# Patient Record
Sex: Male | Born: 1980 | Race: Black or African American | Hispanic: No | Marital: Single | Smoking: Former smoker
Health system: Southern US, Community
[De-identification: ages and names within clinical notes are randomized; demographics above are authoritative.]

---

## 2003-04-05 ENCOUNTER — Emergency Department (HOSPITAL_COMMUNITY): Admission: EM | Admit: 2003-04-05 | Discharge: 2003-04-05 | Payer: Self-pay | Admitting: Emergency Medicine

## 2003-04-15 ENCOUNTER — Emergency Department (HOSPITAL_COMMUNITY): Admission: EM | Admit: 2003-04-15 | Discharge: 2003-04-15 | Payer: Self-pay | Admitting: Emergency Medicine

## 2003-07-31 ENCOUNTER — Emergency Department (HOSPITAL_COMMUNITY): Admission: EM | Admit: 2003-07-31 | Discharge: 2003-07-31 | Payer: Self-pay | Admitting: Emergency Medicine

## 2003-08-07 ENCOUNTER — Emergency Department (HOSPITAL_COMMUNITY): Admission: EM | Admit: 2003-08-07 | Discharge: 2003-08-07 | Payer: Self-pay | Admitting: Emergency Medicine

## 2003-08-09 ENCOUNTER — Emergency Department (HOSPITAL_COMMUNITY): Admission: EM | Admit: 2003-08-09 | Discharge: 2003-08-09 | Payer: Self-pay | Admitting: *Deleted

## 2003-08-11 ENCOUNTER — Emergency Department (HOSPITAL_COMMUNITY): Admission: EM | Admit: 2003-08-11 | Discharge: 2003-08-11 | Payer: Self-pay | Admitting: Emergency Medicine

## 2003-11-30 ENCOUNTER — Emergency Department (HOSPITAL_COMMUNITY): Admission: EM | Admit: 2003-11-30 | Discharge: 2003-11-30 | Payer: Self-pay | Admitting: Emergency Medicine

## 2004-04-14 ENCOUNTER — Emergency Department (HOSPITAL_COMMUNITY): Admission: EM | Admit: 2004-04-14 | Discharge: 2004-04-14 | Payer: Self-pay | Admitting: Emergency Medicine

## 2004-04-25 ENCOUNTER — Emergency Department (HOSPITAL_COMMUNITY): Admission: EM | Admit: 2004-04-25 | Discharge: 2004-04-25 | Payer: Self-pay | Admitting: Emergency Medicine

## 2010-07-12 ENCOUNTER — Emergency Department (HOSPITAL_BASED_OUTPATIENT_CLINIC_OR_DEPARTMENT_OTHER)
Admission: EM | Admit: 2010-07-12 | Discharge: 2010-07-12 | Disposition: A | Discharge status: Home / Self Care | Payer: Self-pay | Attending: Emergency Medicine | Admitting: Emergency Medicine

## 2010-07-12 DIAGNOSIS — J45909 Unspecified asthma, uncomplicated: Secondary | ICD-10-CM | POA: Insufficient documentation

## 2010-07-12 DIAGNOSIS — F17200 Nicotine dependence, unspecified, uncomplicated: Secondary | ICD-10-CM | POA: Insufficient documentation

## 2010-08-04 ENCOUNTER — Emergency Department (HOSPITAL_BASED_OUTPATIENT_CLINIC_OR_DEPARTMENT_OTHER)
Admission: EM | Admit: 2010-08-04 | Discharge: 2010-08-04 | Disposition: A | Discharge status: Home / Self Care | Payer: Self-pay | Attending: Emergency Medicine | Admitting: Emergency Medicine

## 2010-08-04 DIAGNOSIS — F17200 Nicotine dependence, unspecified, uncomplicated: Secondary | ICD-10-CM | POA: Insufficient documentation

## 2010-08-04 DIAGNOSIS — J45901 Unspecified asthma with (acute) exacerbation: Secondary | ICD-10-CM | POA: Insufficient documentation

## 2010-08-04 DIAGNOSIS — R0602 Shortness of breath: Secondary | ICD-10-CM | POA: Insufficient documentation

## 2010-12-06 ENCOUNTER — Encounter: Payer: Self-pay | Admitting: *Deleted

## 2010-12-06 ENCOUNTER — Emergency Department (HOSPITAL_BASED_OUTPATIENT_CLINIC_OR_DEPARTMENT_OTHER)
Admission: EM | Admit: 2010-12-06 | Discharge: 2010-12-07 | Disposition: A | Discharge status: Home / Self Care | Payer: Self-pay | Attending: Emergency Medicine | Admitting: Emergency Medicine

## 2010-12-06 DIAGNOSIS — J069 Acute upper respiratory infection, unspecified: Secondary | ICD-10-CM | POA: Insufficient documentation

## 2010-12-06 DIAGNOSIS — R0602 Shortness of breath: Secondary | ICD-10-CM | POA: Insufficient documentation

## 2010-12-06 DIAGNOSIS — J45909 Unspecified asthma, uncomplicated: Secondary | ICD-10-CM | POA: Insufficient documentation

## 2010-12-06 NOTE — ED Notes (Signed)
atient states he "having problems with his breathing". He has asthma and has used his nebulizer today

## 2010-12-07 MED ORDER — PREDNISONE 10 MG PO TABS: 20.0000 mg | ORAL_TABLET | Freq: Every day | ORAL | Status: AC

## 2010-12-07 MED ORDER — ALBUTEROL SULFATE (5 MG/ML) 0.5% IN NEBU
INHALATION_SOLUTION | RESPIRATORY_TRACT | Status: AC
  Administered 2010-12-07: 5 mg via RESPIRATORY_TRACT
  Filled 2010-12-07: qty 1

## 2010-12-07 MED ORDER — ALBUTEROL SULFATE (2.5 MG/3ML) 0.083% IN NEBU: 2.5000 mg | INHALATION_SOLUTION | Freq: Four times a day (QID) | RESPIRATORY_TRACT | Status: DC | PRN

## 2010-12-07 MED ORDER — PREDNISONE 50 MG PO TABS
60.0000 mg | ORAL_TABLET | Freq: Once | ORAL | Status: AC
  Administered 2010-12-07: 60 mg via ORAL
  Filled 2010-12-07: qty 1

## 2010-12-07 MED ORDER — ALBUTEROL SULFATE (5 MG/ML) 0.5% IN NEBU
5.0000 mg | INHALATION_SOLUTION | Freq: Once | RESPIRATORY_TRACT | Status: AC
  Administered 2010-12-07: 5 mg via RESPIRATORY_TRACT

## 2010-12-07 MED ORDER — ALBUTEROL SULFATE HFA 108 (90 BASE) MCG/ACT IN AERS
2.0000 | INHALATION_SPRAY | RESPIRATORY_TRACT | Status: DC | PRN
  Administered 2010-12-07: 2 via RESPIRATORY_TRACT
  Filled 2010-12-07: qty 6.7

## 2010-12-07 MED ORDER — IPRATROPIUM BROMIDE 0.02 % IN SOLN
RESPIRATORY_TRACT | Status: AC
  Administered 2010-12-07: 0.5 mg via RESPIRATORY_TRACT
  Filled 2010-12-07: qty 2.5

## 2010-12-07 MED ORDER — IPRATROPIUM BROMIDE 0.02 % IN SOLN
0.5000 mg | Freq: Once | RESPIRATORY_TRACT | Status: AC
  Administered 2010-12-07: 0.5 mg via RESPIRATORY_TRACT

## 2010-12-07 MED ORDER — ALBUTEROL SULFATE (5 MG/ML) 0.5% IN NEBU: 2.5000 mg | INHALATION_SOLUTION | Freq: Once | RESPIRATORY_TRACT | Status: DC

## 2010-12-07 NOTE — ED Provider Notes (Signed)
History     CSN: [id number] Arrival date & time: 12/06/2010 11:52 PM   First MD Initiated Contact with Patient 12/07/10 0050      Chief Complaint  Patient presents with  . Shortness of Breath    (Consider location/radiation/quality/duration/timing/severity/associated sxs/prior treatment) Patient is a 30 y.o. male presenting with shortness of breath. The history is provided by the patient.  Shortness of Breath  The current episode started yesterday. The problem occurs continuously. The problem has been gradually worsening. The problem is moderate. Associated symptoms include cough and wheezing. Pertinent negatives include no chest pain, no fever and no shortness of breath.   patient is a smoker has a history of asthma but does not have an inhaler. He is requesting a prescription for his nebulizer at home as well as a hand-held inhaler as well. No fevers or chills or sick contacts. No nausea or vomiting. He does have some congestion last few days but no sore throat, rashes or recent travel. Severity is moderate in nature. No alleviating or exacerbating factors.  Past Medical History  Diagnosis Date  . Asthma     History reviewed. No pertinent past surgical history.  No family history on file.  History  Substance Use Topics  . Smoking status: Former Games developer  . Smokeless tobacco: Not on file  . Alcohol Use:       Review of Systems  Constitutional: Negative for fever and chills.  HENT: Negative for neck pain and neck stiffness.   Eyes: Negative for pain.  Respiratory: Positive for cough and wheezing. Negative for shortness of breath.   Cardiovascular: Negative for chest pain.  Gastrointestinal: Negative for abdominal pain.  Genitourinary: Negative for dysuria.  Musculoskeletal: Negative for back pain.  Skin: Negative for rash.  Neurological: Negative for headaches.  All other systems reviewed and are negative.    Allergies  Review of patient's allergies indicates no  known allergies.  Home Medications   Current Outpatient Rx  Name Route Sig Dispense Refill  . ALBUTEROL SULFATE (5 MG/ML) 0.5% IN NEBU Nebulization Take 2.5 mg by nebulization every 6 (six) hours as needed.        BP 140/80  Pulse 87  Temp(Src) 98.1 F (36.7 C) (Oral)  Resp 20  SpO2 98%  Physical Exam  Constitutional: He is oriented to person, place, and time. He appears well-developed and well-nourished.  HENT:  Head: Normocephalic and atraumatic.  Eyes: Conjunctivae and EOM are normal. Pupils are equal, round, and reactive to light.  Neck: Trachea normal. Neck supple. No thyromegaly present.  Cardiovascular: Normal rate, regular rhythm, S1 normal, S2 normal and normal pulses.     No systolic murmur is present   No diastolic murmur is present  Pulses:      Radial pulses are 2+ on the right side, and 2+ on the left side.  Pulmonary/Chest: Effort normal. He has wheezes. He has no rhonchi.  Abdominal: Soft. Normal appearance and bowel sounds are normal. There is no tenderness. There is no CVA tenderness and negative Murphy's sign.  Musculoskeletal:       BLE:s Calves nontender, no cords or erythema, negative Homans sign  Neurological: He is alert and oriented to person, place, and time. He has normal strength. No cranial nerve deficit or sensory deficit. GCS eye subscore is 4. GCS verbal subscore is 5. GCS motor subscore is 6.  Skin: Skin is warm and dry. No rash noted. He is not diaphoretic.  Psychiatric: His speech is normal.  Cooperative and appropriate    ED Course  Procedures (including critical care time)  Albuterol treatment which improved symptoms on recheck wheezes resolved.   MDM   Clinical URI with wheezing. Albuterol provided. By mouth steroids. Prescriptions provided for prednisone and albuterol. No hypoxia or respiratory distress, stable for discharge home        Sunnie Nielsen, MD 12/07/10 0145

## 2010-12-07 NOTE — ED Notes (Signed)
Pt has an hx of asthma and states that he took his HHN Albuterol tx and his MDI Albuterol with no relief. Pt states that he still feels tight and SHOB. Pt does has a right sided inspiratory wheeze.

## 2011-09-12 ENCOUNTER — Encounter (HOSPITAL_BASED_OUTPATIENT_CLINIC_OR_DEPARTMENT_OTHER): Payer: Self-pay | Admitting: *Deleted

## 2011-09-12 ENCOUNTER — Emergency Department (HOSPITAL_BASED_OUTPATIENT_CLINIC_OR_DEPARTMENT_OTHER)
Admission: EM | Admit: 2011-09-12 | Discharge: 2011-09-12 | Disposition: A | Discharge status: Home / Self Care | Payer: Self-pay | Attending: Emergency Medicine | Admitting: Emergency Medicine

## 2011-09-12 DIAGNOSIS — J45901 Unspecified asthma with (acute) exacerbation: Secondary | ICD-10-CM | POA: Insufficient documentation

## 2011-09-12 DIAGNOSIS — Z87891 Personal history of nicotine dependence: Secondary | ICD-10-CM | POA: Insufficient documentation

## 2011-09-12 MED ORDER — IPRATROPIUM BROMIDE 0.02 % IN SOLN
0.5000 mg | Freq: Once | RESPIRATORY_TRACT | Status: AC
  Administered 2011-09-12: 0.5 mg via RESPIRATORY_TRACT

## 2011-09-12 MED ORDER — ALBUTEROL SULFATE (5 MG/ML) 0.5% IN NEBU
INHALATION_SOLUTION | RESPIRATORY_TRACT | Status: AC
  Administered 2011-09-12: 5 mg via RESPIRATORY_TRACT
  Filled 2011-09-12: qty 1

## 2011-09-12 MED ORDER — IPRATROPIUM BROMIDE 0.02 % IN SOLN
RESPIRATORY_TRACT | Status: AC
  Administered 2011-09-12: 0.5 mg via RESPIRATORY_TRACT
  Filled 2011-09-12: qty 2.5

## 2011-09-12 MED ORDER — ALBUTEROL SULFATE (5 MG/ML) 0.5% IN NEBU
5.0000 mg | INHALATION_SOLUTION | Freq: Once | RESPIRATORY_TRACT | Status: AC
  Administered 2011-09-12: 5 mg via RESPIRATORY_TRACT

## 2011-09-12 MED ORDER — ALBUTEROL SULFATE HFA 108 (90 BASE) MCG/ACT IN AERS: 2.0000 | INHALATION_SPRAY | RESPIRATORY_TRACT | Status: DC | PRN

## 2011-09-12 MED ORDER — DEXAMETHASONE SODIUM PHOSPHATE 10 MG/ML IJ SOLN
10.0000 mg | Freq: Once | INTRAMUSCULAR | Status: AC
  Administered 2011-09-12: 10 mg
  Filled 2011-09-12: qty 1

## 2011-09-12 MED ORDER — ALBUTEROL SULFATE HFA 108 (90 BASE) MCG/ACT IN AERS
2.0000 | INHALATION_SPRAY | Freq: Once | RESPIRATORY_TRACT | Status: AC
  Administered 2011-09-12: 2 via RESPIRATORY_TRACT
  Filled 2011-09-12: qty 6.7

## 2011-09-12 NOTE — ED Provider Notes (Signed)
History   This chart was scribed for Bryan Razor, MD by Shari Heritage. The patient was seen in room MH02/MH02. Patient's care was started at 1555.     CSN: [id number]  Arrival date & time 09/12/11  1555   First MD Initiated Contact with Patient 09/12/11 1601      Chief Complaint  Patient presents with  . Shortness of Breath  . Asthma    (Consider location/radiation/quality/duration/timing/severity/associated sxs/prior treatment) Patient is a 31 y.o. male presenting with shortness of breath and asthma. The history is provided by the patient. No language interpreter was used.  Shortness of Breath  The current episode started 3 to 5 days ago. The problem occurs continuously. The problem has been gradually worsening. The problem is moderate. The symptoms are relieved by beta-agonist inhalers. Nothing aggravates the symptoms. Associated symptoms include chest pain and shortness of breath. Pertinent negatives include no fever, no stridor and no wheezing. There was no intake of a foreign body. The Heimlich maneuver was not attempted. His past medical history is significant for asthma. He has been behaving normally.  Asthma This is a chronic problem. Associated symptoms include chest pain and shortness of breath.    Bryan Wilkinson is a 31 y.o. male with a h/o of asthma who presents to the Emergency Department complaining of gradually worsening SOB onset 3 days ago. Associated symptoms include chest pain. He currently uses an albuterol inhaler and a nebulizer. Patient says that he experiences weekly exacerbation of his asthma symptoms and has had to visit the hospital for treatment. Patient denies fever or chills. Patient denies any other significant medical or surgical history. He is a former smoker. Patient does not have PCP. He typically gets medical prescriptions from the hospital. Patient doesn't have insurance.  Past Medical History  Diagnosis Date  . Asthma     History reviewed. No  pertinent past surgical history.  No family history on file.  History  Substance Use Topics  . Smoking status: Former Games developer  . Smokeless tobacco: Never Used  . Alcohol Use: Not on file      Review of Systems  Constitutional: Negative for fever and chills.  Respiratory: Positive for shortness of breath. Negative for wheezing and stridor.   Cardiovascular: Positive for chest pain.  All other systems reviewed and are negative.    Allergies  Review of patient's allergies indicates no known allergies.  Home Medications   Current Outpatient Rx  Name Route Sig Dispense Refill  . ALBUTEROL SULFATE (2.5 MG/3ML) 0.083% IN NEBU Nebulization Take 3 mLs (2.5 mg total) by nebulization every 6 (six) hours as needed for wheezing. 75 mL 12  . ALBUTEROL SULFATE (5 MG/ML) 0.5% IN NEBU Nebulization Take 2.5 mg by nebulization every 6 (six) hours as needed.        BP 148/90  Pulse 98  Temp 98.9 F (37.2 C) (Oral)  Resp 20  Ht 6' (1.829 m)  Wt 243 lb (110.224 kg)  BMI 32.96 kg/m2  SpO2 97%  Physical Exam  Nursing note and vitals reviewed. Constitutional: He appears well-developed and well-nourished. No distress.  HENT:  Head: Normocephalic and atraumatic.  Eyes: Conjunctivae are normal. Right eye exhibits no discharge. Left eye exhibits no discharge.  Neck: Neck supple.  Cardiovascular: Regular rhythm and normal heart sounds.  Tachycardia present.  Exam reveals no gallop and no friction rub.   No murmur heard.      Heart was slightly tachycardic.  Pulmonary/Chest: Effort normal and breath sounds  normal. No respiratory distress. He has no rhonchi. He has no rales.       Diminished breath sounds.  Abdominal: Soft. He exhibits no distension. There is no tenderness.  Musculoskeletal: He exhibits no edema and no tenderness.       No calf tenderness. No lower extremity edema. No palpable cords. Negative homans.  Neurological: He is alert.  Skin: Skin is warm and dry.  Psychiatric: He  has a normal mood and affect. His behavior is normal. Thought content normal.    ED Course  Procedures (including critical care time) DIAGNOSTIC STUDIES: Oxygen Saturation is 97% on room air, adeaquat by my interpretation.    COORDINATION OF CARE: 4:08pm- Patient informed of current plan for treatment and evaluation and agrees with plan at this time.     Labs Reviewed - No data to display  No results found.   1. Asthma exacerbation       MDM  31yf with mild asthma exacerbation. No indication for imaging. Plan continued symptomatic tx. Given very mild severity of symptoms will defer from further steroids.      I personally preformed the services scribed in my presence. The recorded information has been reviewed and considered. Bryan Razor, MD.    Bryan Razor, MD 09/17/11 [phone]

## 2011-09-12 NOTE — ED Notes (Signed)
Pt has hx of asthma- feels sob x 3 days- out of inhaler

## 2012-10-17 ENCOUNTER — Emergency Department (HOSPITAL_COMMUNITY)
Admission: EM | Admit: 2012-10-17 | Discharge: 2012-10-17 | Disposition: A | Discharge status: Home / Self Care | Payer: Self-pay | Attending: Emergency Medicine | Admitting: Emergency Medicine

## 2012-10-17 ENCOUNTER — Emergency Department (HOSPITAL_COMMUNITY): Payer: Self-pay

## 2012-10-17 ENCOUNTER — Encounter (HOSPITAL_COMMUNITY): Payer: Self-pay | Admitting: Emergency Medicine

## 2012-10-17 DIAGNOSIS — R109 Unspecified abdominal pain: Secondary | ICD-10-CM

## 2012-10-17 DIAGNOSIS — R1032 Left lower quadrant pain: Secondary | ICD-10-CM | POA: Insufficient documentation

## 2012-10-17 DIAGNOSIS — Z87891 Personal history of nicotine dependence: Secondary | ICD-10-CM | POA: Insufficient documentation

## 2012-10-17 DIAGNOSIS — J45901 Unspecified asthma with (acute) exacerbation: Secondary | ICD-10-CM | POA: Insufficient documentation

## 2012-10-17 DIAGNOSIS — J9801 Acute bronchospasm: Secondary | ICD-10-CM

## 2012-10-17 LAB — CBC WITH DIFFERENTIAL/PLATELET
Basophils Absolute: 0.1 10*3/uL (ref 0.0–0.1)
Basophils Relative: 1 % (ref 0–1)
Hemoglobin: 15.1 g/dL (ref 13.0–17.0)
MCHC: 35.9 g/dL (ref 30.0–36.0)
Monocytes Relative: 9 % (ref 3–12)
Neutro Abs: 4.9 10*3/uL (ref 1.7–7.7)
Neutrophils Relative %: 55 % (ref 43–77)
Platelets: 228 10*3/uL (ref 150–400)
RDW: 12.3 % (ref 11.5–15.5)

## 2012-10-17 LAB — COMPREHENSIVE METABOLIC PANEL WITH GFR
ALT: 15 U/L (ref 0–53)
AST: 13 U/L (ref 0–37)
Albumin: 3.7 g/dL (ref 3.5–5.2)
Alkaline Phosphatase: 51 U/L (ref 39–117)
BUN: 10 mg/dL (ref 6–23)
CO2: 25 meq/L (ref 19–32)
Calcium: 9.7 mg/dL (ref 8.4–10.5)
Chloride: 103 meq/L (ref 96–112)
Creatinine, Ser: 0.94 mg/dL (ref 0.50–1.35)
GFR calc Af Amer: >90 mL/min
GFR calc non Af Amer: >90 mL/min
Glucose, Bld: 105 mg/dL — ABNORMAL HIGH (ref 70–99)
Potassium: 3.8 meq/L (ref 3.5–5.1)
Sodium: 140 meq/L (ref 135–145)
Total Bilirubin: 0.5 mg/dL (ref 0.3–1.2)
Total Protein: 7.5 g/dL (ref 6.0–8.3)

## 2012-10-17 LAB — URINALYSIS, ROUTINE W REFLEX MICROSCOPIC
Bilirubin Urine: NEGATIVE
Glucose, UA: NEGATIVE mg/dL
Hgb urine dipstick: NEGATIVE
Ketones, ur: NEGATIVE mg/dL
Leukocytes, UA: NEGATIVE
Nitrite: NEGATIVE
Protein, ur: NEGATIVE mg/dL
Specific Gravity, Urine: 1.027 (ref 1.005–1.030)
Urobilinogen, UA: 0.2 mg/dL (ref 0.0–1.0)
pH: 6.5 (ref 5.0–8.0)

## 2012-10-17 MED ORDER — ALBUTEROL SULFATE HFA 108 (90 BASE) MCG/ACT IN AERS
2.0000 | INHALATION_SPRAY | RESPIRATORY_TRACT | Status: DC
  Administered 2012-10-17: 2 via RESPIRATORY_TRACT
  Filled 2012-10-17: qty 6.7

## 2012-10-17 NOTE — ED Notes (Signed)
Pt c/o abdominal pain since Thursday. Also c/o increase sob due to asthma, requesting prescription for breathing treatment. Left lower abdominal pain, intermittent sharp pain. Vomiting once this morning, states "there was a little bit of blood in my vomit". Constipation since pain started, took laxatives, "helped a little". Pain now 6/10, feels short of breath.

## 2012-10-17 NOTE — ED Provider Notes (Signed)
CSN: [id number]     Arrival date & time 10/17/12  1503 History   First MD Initiated Contact with Patient 10/17/12 1807     Chief Complaint  Patient presents with  . Abdominal Pain   (Consider location/radiation/quality/duration/timing/severity/associated sxs/prior Treatment) Patient is a 32 y.o. male presenting with abdominal pain. The history is provided by the patient.  Abdominal Pain  patient complaining of four-day history of suprapubic and left lower quadrant pain. Symptoms characterized as sharp and have gradually been getting better. Was concerned that he was constipated and did use a laxative without any relief. Denies any dysuria or hematuria. No fever vomiting or diarrhea. No prior history of same. Also complains of being short of breath from his baseline history of asthma and states that he has run out of his inhaler.  Past Medical History  Diagnosis Date  . Asthma    History reviewed. No pertinent past surgical history. History reviewed. No pertinent family history. History  Substance Use Topics  . Smoking status: Former Games developer  . Smokeless tobacco: Never Used  . Alcohol Use: Not on file    Review of Systems  Gastrointestinal: Positive for abdominal pain.  All other systems reviewed and are negative.    Allergies  Review of patient's allergies indicates no known allergies.  Home Medications   Current Outpatient Rx  Name  Route  Sig  Dispense  Refill  . EXPIRED: albuterol (PROVENTIL HFA;VENTOLIN HFA) 108 (90 BASE) MCG/ACT inhaler   Inhalation   Inhale 2 puffs into the lungs every 4 (four) hours as needed for wheezing.   1 Inhaler   3   . EXPIRED: albuterol (PROVENTIL) (2.5 MG/3ML) 0.083% nebulizer solution   Nebulization   Take 3 mLs (2.5 mg total) by nebulization every 6 (six) hours as needed for wheezing.   75 mL   12   . albuterol (PROVENTIL) (5 MG/ML) 0.5% nebulizer solution   Nebulization   Take 2.5 mg by nebulization every 6 (six) hours as needed.             BP 125/69  Pulse 77  Temp(Src) 97.9 F (36.6 C) (Oral)  Resp 16  SpO2 98% Physical Exam  Nursing note and vitals reviewed. Constitutional: He is oriented to person, place, and time. He appears well-developed and well-nourished.  Non-toxic appearance. No distress.  HENT:  Head: Normocephalic and atraumatic.  Eyes: Conjunctivae, EOM and lids are normal. Pupils are equal, round, and reactive to light.  Neck: Normal range of motion. Neck supple. No tracheal deviation present. No mass present.  Cardiovascular: Normal rate, regular rhythm and normal heart sounds.  Exam reveals no gallop.   No murmur heard. Pulmonary/Chest: Effort normal. No stridor. No respiratory distress. He has decreased breath sounds. He has no wheezes. He has no rhonchi. He has no rales.  Abdominal: Soft. Normal appearance and bowel sounds are normal. He exhibits no distension. There is tenderness in the left lower quadrant. There is no rigidity, no rebound, no guarding and no CVA tenderness.  Musculoskeletal: Normal range of motion. He exhibits no edema and no tenderness.  Neurological: He is alert and oriented to person, place, and time. He has normal strength. No cranial nerve deficit or sensory deficit. GCS eye subscore is 4. GCS verbal subscore is 5. GCS motor subscore is 6.  Skin: Skin is warm and dry. No abrasion and no rash noted.  Psychiatric: He has a normal mood and affect. His speech is normal and behavior is normal.  ED Course  Procedures (including critical care time) Labs Review Labs Reviewed  COMPREHENSIVE METABOLIC PANEL - Abnormal; Notable for the following:    Glucose, Bld 105 (*)    All other components within normal limits  CBC WITH DIFFERENTIAL  URINALYSIS, ROUTINE W REFLEX MICROSCOPIC   Imaging Review Dg Abd Acute W/chest  10/17/2012   CLINICAL DATA:  Abdominal pain, left lower quadrant pain.  EXAM: ACUTE ABDOMEN SERIES (ABDOMEN 2 VIEW & CHEST 1 VIEW)  COMPARISON:  08/09/2003   FINDINGS: There is no evidence of dilated bowel loops or free intraperitoneal air. No radiopaque calculi or other significant radiographic abnormality is seen. Heart size and mediastinal contours are within normal limits. Both lungs are clear.  IMPRESSION: Negative abdominal radiographs.  No acute cardiopulmonary disease.   Electronically Signed   By: Charlett Nose   On: 10/17/2012 15:46    MDM  No diagnosis found. Patient given albuterol here feels better. His abdomen is nonsurgical at this time. He is requesting to be discharged    Toy Baker, MD 10/17/12 2014

## 2012-10-17 NOTE — ED Notes (Signed)
Patient is requesting to go home; informed EDP. Dr. Freida Busman states that he will come see the patient

## 2012-10-17 NOTE — ED Notes (Signed)
MD at bedside. 

## 2012-10-17 NOTE — ED Notes (Signed)
Pt c/o lower abd pain; pt sts thinks could be constipated and used laxative with some relief; pt sts pain x 4 days

## 2012-10-30 ENCOUNTER — Emergency Department (HOSPITAL_COMMUNITY)
Admission: EM | Admit: 2012-10-30 | Discharge: 2012-10-30 | Disposition: A | Discharge status: Home / Self Care | Payer: Self-pay | Attending: Emergency Medicine | Admitting: Emergency Medicine

## 2012-10-30 DIAGNOSIS — Z79899 Other long term (current) drug therapy: Secondary | ICD-10-CM | POA: Insufficient documentation

## 2012-10-30 DIAGNOSIS — J45901 Unspecified asthma with (acute) exacerbation: Secondary | ICD-10-CM | POA: Insufficient documentation

## 2012-10-30 DIAGNOSIS — J4521 Mild intermittent asthma with (acute) exacerbation: Secondary | ICD-10-CM

## 2012-10-30 DIAGNOSIS — Z87891 Personal history of nicotine dependence: Secondary | ICD-10-CM | POA: Insufficient documentation

## 2012-10-30 DIAGNOSIS — J45909 Unspecified asthma, uncomplicated: Secondary | ICD-10-CM

## 2012-10-30 DIAGNOSIS — R0789 Other chest pain: Secondary | ICD-10-CM | POA: Insufficient documentation

## 2012-10-30 DIAGNOSIS — R059 Cough, unspecified: Secondary | ICD-10-CM | POA: Insufficient documentation

## 2012-10-30 DIAGNOSIS — R05 Cough: Secondary | ICD-10-CM | POA: Insufficient documentation

## 2012-10-30 MED ORDER — ALBUTEROL SULFATE HFA 108 (90 BASE) MCG/ACT IN AERS: 2.0000 | INHALATION_SPRAY | RESPIRATORY_TRACT | Status: DC | PRN

## 2012-10-30 MED ORDER — ALBUTEROL SULFATE (5 MG/ML) 0.5% IN NEBU
2.5000 mg | INHALATION_SOLUTION | Freq: Once | RESPIRATORY_TRACT | Status: AC
  Administered 2012-10-30: 2.5 mg via RESPIRATORY_TRACT
  Filled 2012-10-30: qty 0.5

## 2012-10-30 MED ORDER — ALBUTEROL SULFATE HFA 108 (90 BASE) MCG/ACT IN AERS
2.0000 | INHALATION_SPRAY | Freq: Once | RESPIRATORY_TRACT | Status: AC
  Administered 2012-10-30: 2 via RESPIRATORY_TRACT
  Filled 2012-10-30: qty 6.7

## 2012-10-30 MED ORDER — ALBUTEROL SULFATE (2.5 MG/3ML) 0.083% IN NEBU: 2.5000 mg | INHALATION_SOLUTION | Freq: Four times a day (QID) | RESPIRATORY_TRACT | Status: DC | PRN

## 2012-10-30 MED ORDER — DEXAMETHASONE SODIUM PHOSPHATE 10 MG/ML IJ SOLN
10.0000 mg | Freq: Once | INTRAMUSCULAR | Status: AC
  Administered 2012-10-30: 10 mg via INTRAMUSCULAR
  Filled 2012-10-30: qty 1

## 2012-10-30 MED ORDER — IPRATROPIUM BROMIDE 0.02 % IN SOLN
0.5000 mg | Freq: Once | RESPIRATORY_TRACT | Status: AC
  Administered 2012-10-30: 0.5 mg via RESPIRATORY_TRACT
  Filled 2012-10-30: qty 2.5

## 2012-10-30 NOTE — ED Notes (Signed)
Bed: WTR8 Expected date:  Expected time:  Means of arrival:  Comments: Gu

## 2012-10-30 NOTE — ED Notes (Signed)
Pt reports hx of asthma, flare up started last night. Reports breathing is uncomfortable. Pt reports he is out of all his medications and inhaler. Denies pain

## 2012-10-30 NOTE — ED Provider Notes (Signed)
Medical screening examination/treatment/procedure(s) were performed by non-physician practitioner and as supervising physician I was immediately available for consultation/collaboration.   Charles B. Sheldon, MD 10/30/12 2012 

## 2012-10-30 NOTE — Progress Notes (Signed)
[facility] Provided patient with a list of primary care resources.

## 2012-10-30 NOTE — ED Provider Notes (Signed)
CSN: [id number]     Arrival date & time 10/30/12  1014 History   First MD Initiated Contact with Patient 10/30/12 1215     Chief Complaint  Patient presents with  . Asthma   (Consider location/radiation/quality/duration/timing/severity/associated sxs/prior Treatment) HPI Comments: Patient is a 32 year old male past medical history significant for asthma presented to the emergency department for asthma flareup that began last evening. Patient states he does not have any of his nebulizer medication or inhalers and has been unable to control his wheezing without the use. He states his breathing is uncomfortable currently is having minimal chest tightness. Patient also endorses mild nonproductive cough. Patient states this feels like typical asthma flareups. Denies fevers, chills, chest pain, shortness of breath. PERC negative   Past Medical History  Diagnosis Date  . Asthma    No past surgical history on file. No family history on file. History  Substance Use Topics  . Smoking status: Former Games developer  . Smokeless tobacco: Never Used  . Alcohol Use: Not on file    Review of Systems  Constitutional: Negative for fever and chills.  Respiratory: Positive for cough, chest tightness and wheezing. Negative for shortness of breath and stridor.   Cardiovascular: Negative for chest pain.  All other systems reviewed and are negative.    Allergies  Review of patient's allergies indicates no known allergies.  Home Medications   Current Outpatient Rx  Name  Route  Sig  Dispense  Refill  . albuterol (PROVENTIL HFA;VENTOLIN HFA) 108 (90 BASE) MCG/ACT inhaler   Inhalation   Inhale 2 puffs into the lungs every 6 (six) hours as needed for wheezing.         Marland Kitchen albuterol (PROVENTIL) (5 MG/ML) 0.5% nebulizer solution   Nebulization   Take 2.5 mg by nebulization every 6 (six) hours as needed. For wheezing         . Doxylamine Succinate, Sleep, (SLEEP AID PO)   Oral   Take 2 tablets by mouth  as needed (sleep).         Marland Kitchen KRILL OIL PO   Oral   Take 1 capsule by mouth daily.         Marland Kitchen albuterol (PROVENTIL HFA;VENTOLIN HFA) 108 (90 BASE) MCG/ACT inhaler   Inhalation   Inhale 2 puffs into the lungs every 4 (four) hours as needed for wheezing.   1 Inhaler   1   . albuterol (PROVENTIL) (2.5 MG/3ML) 0.083% nebulizer solution   Nebulization   Take 3 mLs (2.5 mg total) by nebulization every 6 (six) hours as needed for wheezing.   75 mL   12    BP 137/74  Pulse 84  Temp(Src) 97.8 F (36.6 C) (Oral)  Resp 16  SpO2 97% Physical Exam  Constitutional: He is oriented to person, place, and time. He appears well-developed and well-nourished. No distress.  HENT:  Head: Normocephalic and atraumatic.  Right Ear: External ear normal.  Left Ear: External ear normal.  Nose: Nose normal.  Mouth/Throat: Oropharynx is clear and moist. No oropharyngeal exudate.  Eyes: Conjunctivae are normal.  Neck: Normal range of motion. Neck supple.  Cardiovascular: Normal rate, regular rhythm and normal heart sounds.   Pulmonary/Chest: Effort normal. No respiratory distress. He has wheezes. He has no rales. He exhibits no tenderness.  Abdominal: Soft.  Musculoskeletal: Normal range of motion.  Neurological: He is alert and oriented to person, place, and time.  Skin: Skin is warm and dry. He is not diaphoretic.  ED Course  Procedures (including critical care time)  Medications  dexamethasone (DECADRON) injection 10 mg (10 mg Intramuscular Given 10/30/12 1322)  albuterol (PROVENTIL HFA;VENTOLIN HFA) 108 (90 BASE) MCG/ACT inhaler 2 puff (2 puffs Inhalation Given 10/30/12 1309)  ipratropium (ATROVENT) nebulizer solution 0.5 mg (0.5 mg Nebulization Given 10/30/12 1308)  albuterol (PROVENTIL) (5 MG/ML) 0.5% nebulizer solution 2.5 mg (2.5 mg Nebulization Given 10/30/12 1308)   Labs Review Labs Reviewed - No data to display Imaging Review No results found.  MDM   1. Asthma   2. Asthma  exacerbation attacks, mild intermittent      Afebrile, NAD, non-toxic appearing, AAOx4. Patient ambulated in ED with O2 saturations maintained >90, no current signs of respiratory distress. Lung exam improved after nebulizer treatment. Prednisone given in the ED and given Decadron in ED. Pt states they are breathing at baseline. Pt has been instructed to continue using prescribed medications and to speak with PCP about today's exacerbation. Return rpecautions discussed. Nebulizer and inhaler refilled. Patient is agreeable to plan. Patient is stable at time of discharge        Jeannetta Ellis, PA-C 10/30/12 1828

## 2012-11-02 ENCOUNTER — Emergency Department (HOSPITAL_COMMUNITY)
Admission: EM | Admit: 2012-11-02 | Discharge: 2012-11-02 | Disposition: A | Discharge status: Home / Self Care | Payer: Self-pay | Attending: Emergency Medicine | Admitting: Emergency Medicine

## 2012-11-02 ENCOUNTER — Encounter (HOSPITAL_COMMUNITY): Payer: Self-pay | Admitting: Emergency Medicine

## 2012-11-02 DIAGNOSIS — Z23 Encounter for immunization: Secondary | ICD-10-CM | POA: Insufficient documentation

## 2012-11-02 DIAGNOSIS — R002 Palpitations: Secondary | ICD-10-CM | POA: Insufficient documentation

## 2012-11-02 DIAGNOSIS — Z79899 Other long term (current) drug therapy: Secondary | ICD-10-CM | POA: Insufficient documentation

## 2012-11-02 DIAGNOSIS — L02416 Cutaneous abscess of left lower limb: Secondary | ICD-10-CM

## 2012-11-02 DIAGNOSIS — J45909 Unspecified asthma, uncomplicated: Secondary | ICD-10-CM | POA: Insufficient documentation

## 2012-11-02 DIAGNOSIS — F17200 Nicotine dependence, unspecified, uncomplicated: Secondary | ICD-10-CM | POA: Insufficient documentation

## 2012-11-02 DIAGNOSIS — L02419 Cutaneous abscess of limb, unspecified: Secondary | ICD-10-CM | POA: Insufficient documentation

## 2012-11-02 MED ORDER — TETANUS-DIPHTH-ACELL PERTUSSIS 5-2.5-18.5 LF-MCG/0.5 IM SUSP
0.5000 mL | Freq: Once | INTRAMUSCULAR | Status: AC
  Administered 2012-11-02: 0.5 mL via INTRAMUSCULAR
  Filled 2012-11-02: qty 0.5

## 2012-11-02 MED ORDER — CEPHALEXIN 500 MG PO CAPS: 500.0000 mg | ORAL_CAPSULE | Freq: Four times a day (QID) | ORAL | Status: DC

## 2012-11-02 MED ORDER — IBUPROFEN 400 MG PO TABS
800.0000 mg | ORAL_TABLET | Freq: Once | ORAL | Status: AC
  Administered 2012-11-02: 800 mg via ORAL
  Filled 2012-11-02: qty 2

## 2012-11-02 NOTE — ED Provider Notes (Signed)
CSN: [id number]     Arrival date & time 11/02/12  1006 History  This chart was scribed for non-physician practitioner, Coral Ceo, PA, working with Shon Baton, MD, by [facility] ED Scribe. This patient was seen in room TR07C/TR07C and the patient's care was started at 11:50 AM.    Chief Complaint  Patient presents with  . Insect Bite    The history is provided by the patient. No language interpreter was used.    HPI Comments: Bryan Wilkinson is a 32 y.o. male who presents to the Emergency Department complaining of 2 small areas of gradually worsening swelling, redness, warmth and pain to his lateral lower left leg onset 2 days ago. Pt believes that the areas may be due to insect bites, although he states that the areas have "come to a head". He had itching initially, which has resolved.  He also states that he has a history of abscesses, which have mainly been to his abdomen. He denies drainage from the areas of swelling. He states that he has full ROM of his left knee and ankle. He states that he is able to ambulate, but with exacerbation of pain. He states that he is unsure if his Tetanus vaccinations are UTD. He denies having a history of DM. He also states that he has believes he has had palpitations intermittently over the past 1-2 months, which are not present currently, but he states that is not why he came to the ED today. He denies fever, chills, SOB, chest pain, appetite changes, calf pain or any other symptoms. Pt is a current some day smoker and an occasional alcohol user.   Past Medical History  Diagnosis Date  . Asthma    History reviewed. No pertinent past surgical history. History reviewed. No pertinent family history.  History  Substance Use Topics  . Smoking status: Current Some Day Smoker  . Smokeless tobacco: Never Used  . Alcohol Use: 1.8 oz/week    3 Cans of beer per week     Comment: RARELY    Review of Systems  Constitutional: Negative for fever,  chills, activity change, appetite change and fatigue.  HENT: Negative for congestion, sore throat, rhinorrhea and neck pain.   Eyes: Negative for visual disturbance.  Respiratory: Negative for cough, shortness of breath and wheezing.   Cardiovascular: Positive for palpitations (per patient). Negative for chest pain and leg swelling.  Gastrointestinal: Negative for nausea, vomiting, abdominal pain, diarrhea and constipation.  Genitourinary: Negative for dysuria and hematuria.  Musculoskeletal: Negative for myalgias, back pain, joint swelling, arthralgias and gait problem.  Skin: Positive for color change (redness around wounds) and wound ( Abscesses). Negative for rash.       Suspected insect bites to LLE.  Neurological: Negative for dizziness, syncope, weakness, light-headedness, numbness and headaches.  Psychiatric/Behavioral: Negative for confusion.  All other systems reviewed and are negative.   Allergies  Banana  Home Medications   Current Outpatient Rx  Name  Route  Sig  Dispense  Refill  . albuterol (PROVENTIL HFA;VENTOLIN HFA) 108 (90 BASE) MCG/ACT inhaler   Inhalation   Inhale 2 puffs into the lungs every 6 (six) hours as needed for wheezing.         Marland Kitchen albuterol (PROVENTIL) (5 MG/ML) 0.5% nebulizer solution   Nebulization   Take 2.5 mg by nebulization every 6 (six) hours as needed. For wheezing         . Doxylamine Succinate, Sleep, (SLEEP AID PO)  Oral   Take 1 tablet by mouth at bedtime as needed (for sleep).          Triage Vitals: BP 138/84  Pulse 90  Temp(Src) 98.8 F (37.1 C) (Oral)  SpO2 97%  Filed Vitals:   11/02/12 1017  BP: 138/84  Pulse: 90  Temp: 98.8 F (37.1 C)  TempSrc: Oral  SpO2: 97%    Physical Exam  Nursing note and vitals reviewed. Constitutional: He is oriented to person, place, and time. He appears well-developed and well-nourished. No distress.  HENT:  Head: Normocephalic and atraumatic.  Right Ear: External ear normal.   Left Ear: External ear normal.  Nose: Nose normal.  Eyes: Conjunctivae and EOM are normal. Right eye exhibits no discharge. Left eye exhibits no discharge.  Neck: Normal range of motion. Neck supple. No tracheal deviation present.  Cardiovascular: Normal rate, regular rhythm, normal heart sounds and intact distal pulses.  Exam reveals no gallop and no friction rub.   No murmur heard. Dorsalis pedis pulses present bilaterally   Pulmonary/Chest: Effort normal and breath sounds normal. No respiratory distress. He has no wheezes. He has no rales. He exhibits no tenderness.  Abdominal: Soft. He exhibits no distension. There is no tenderness.  Musculoskeletal: Normal range of motion. He exhibits no edema and no tenderness.  No calf edema or tenderness bilaterally.  No pedal edema bilaterally.  Patient able to ambulate without difficulty or ataxia.  No limitations with knee ROM or ankle ROM.    Neurological: He is alert and oriented to person, place, and time.  Gross sensation intact in the lower extremities bilaterally  Skin: Skin is warm and dry. He is not diaphoretic.     Two palpable abscesses to the left lateral calf.  3 cm circular area of erythema and mild edema with a area of fluctuance in the center.  There is a small pinpoint scab present in the center of the wound.  No open lacerations or evidence of drainage.  Area is warm to the touch and tender to palpation.  No circumferential edema or erythema.  Edema and erythema is localized to the surrounding lesions.    Psychiatric: He has a normal mood and affect. His behavior is normal.    ED Course  Procedures (including critical care time)  INCISION AND DRAINAGE Performed by: Coral Ceo, PA Consent: Verbal consent obtained. Risks and benefits: risks, benefits and alternatives were discussed Type: abscess  Body area: lateral lower left leg Anesthesia: local infiltration  Incision was made with a scalpel.  Local anesthetic:  lidocaine 2% with epinephrine  Anesthetic total: 4  ml  Complexity: complex Blunt dissection to break up loculations  Drainage: purulent  Drainage amount: Scant, purulent discharge  Dressings: antibiotic ointment and band aids   Patient tolerance: Patient tolerated the procedure well with no immediate complications.   DIAGNOSTIC STUDIES: Oxygen Saturation is 97% on RA, normal by my interpretation.    COORDINATION OF CARE: 11:56 AM- Pt advised of plan to have the areas of abscess drained. Advised pt of plan to update his Tetanus vaccinations. Will order Motrin. Will also order an EKG due to pt's mentioning of palpitations. Pt agrees with plan for treatment.  12:51 PM - Performed incision and drainage of abscessed area.  1:22 PM- Pt informed of EKG findings and interpretatio, indicating that he has a RBBB, but that his heart appears otherwise normal.    Medications  TDaP (BOOSTRIX) injection 0.5 mL (0.5 mLs Intramuscular Given 11/02/12 1209)  ibuprofen (ADVIL,MOTRIN) tablet 800 mg (800 mg Oral Given 11/02/12 1209)   Labs Review Labs Reviewed - No data to display Imaging Review No results found.  MDM   1. Palpitations   2. Abscess of leg, left    Edgardo D Burgher is a 32 y.o. male who presents to the Emergency Department complaining of 2 small areas of gradually worsening swelling, redness, warmth and pain to his lateral lower left leg onset 2 days ago.  Tetanus booster ordered.  Ibuprofen ordered for pain.  EKG ordered to further evaluate palpations, although patient asymptomatic currently.     Rechecks  12:51 PM = Performed incision and drainage of abscessed area. 1:22 PM = Patient informed of EKG findings.  No chest pain, SOB, or palpitations.     Patient likely had two insect bites to the left lower extremity, which evolved into abscesses.  Abscesses were I&D'd in the ED with scant purulent discharge.  He was prescribed keflex for surrounding cellulitis.  He received a  tetanus booster.  He was neurovascularly intact and had no evidence of circumferential edema or erythema.  He also had an EKG done due to complaint of palpitations which showed a incomplete RBBB and no evidence of acute ischemia.  He remained asymptomatic with no chest pain, SOB, or palpitations.  He was instructed to follow-up with a PCP regarding palpitations and likely requires further evaluation by cardiology with Holter monitor.  Patient was instructed to return to the ED if they experience any chest pain, SOB, continued palpitations, fever, drainage, spreading edema/erythema, unilateral leg edema/erythema, or other concerns.  Patient was in agreement with discharge and plan.     Final impressions: 1. Abscesses left lower leg  2. Palpitations     Luiz Iron PA-C   This patient was discussed with Dr. Wilkie Aye (EKG results)    I personally performed the services described in this documentation, which was scribed in my presence. The recorded information has been reviewed and is accurate.        Jillyn Ledger, PA-C 11/03/12 1244

## 2012-11-02 NOTE — ED Notes (Signed)
Started 2 days ago with possible bug bites to left lower leg. Two raised, tender sores noted on left lower leg. Pt unsure if they are insect bites.

## 2012-11-03 NOTE — ED Provider Notes (Signed)
Medical screening examination/treatment/procedure(s) were performed by non-physician practitioner and as supervising physician I was immediately available for consultation/collaboration.  Shon Baton, MD 11/03/12 [phone]

## 2012-12-13 ENCOUNTER — Encounter (HOSPITAL_COMMUNITY): Payer: Self-pay | Admitting: Emergency Medicine

## 2012-12-13 ENCOUNTER — Emergency Department (HOSPITAL_COMMUNITY)
Admission: EM | Admit: 2012-12-13 | Discharge: 2012-12-13 | Disposition: A | Discharge status: Home / Self Care | Payer: Self-pay | Attending: Emergency Medicine | Admitting: Emergency Medicine

## 2012-12-13 DIAGNOSIS — Z79899 Other long term (current) drug therapy: Secondary | ICD-10-CM | POA: Insufficient documentation

## 2012-12-13 DIAGNOSIS — J45901 Unspecified asthma with (acute) exacerbation: Secondary | ICD-10-CM | POA: Insufficient documentation

## 2012-12-13 DIAGNOSIS — F17200 Nicotine dependence, unspecified, uncomplicated: Secondary | ICD-10-CM | POA: Insufficient documentation

## 2012-12-13 DIAGNOSIS — J069 Acute upper respiratory infection, unspecified: Secondary | ICD-10-CM | POA: Insufficient documentation

## 2012-12-13 DIAGNOSIS — J029 Acute pharyngitis, unspecified: Secondary | ICD-10-CM | POA: Insufficient documentation

## 2012-12-13 DIAGNOSIS — IMO0002 Reserved for concepts with insufficient information to code with codable children: Secondary | ICD-10-CM | POA: Insufficient documentation

## 2012-12-13 MED ORDER — ALBUTEROL SULFATE HFA 108 (90 BASE) MCG/ACT IN AERS: 2.0000 | INHALATION_SPRAY | RESPIRATORY_TRACT | Status: DC | PRN

## 2012-12-13 MED ORDER — PREDNISONE 20 MG PO TABS: 40.0000 mg | ORAL_TABLET | Freq: Every day | ORAL | Status: DC

## 2012-12-13 MED ORDER — CETIRIZINE HCL 10 MG PO TABS: 10.0000 mg | ORAL_TABLET | Freq: Every day | ORAL | Status: DC

## 2012-12-13 MED ORDER — ALBUTEROL SULFATE (2.5 MG/3ML) 0.083% IN NEBU: 2.5000 mg | INHALATION_SOLUTION | RESPIRATORY_TRACT | Status: DC | PRN

## 2012-12-13 MED ORDER — FLUTICASONE PROPIONATE 50 MCG/ACT NA SUSP: 2.0000 | Freq: Every day | NASAL | Status: DC

## 2012-12-13 MED ORDER — ALBUTEROL SULFATE (5 MG/ML) 0.5% IN NEBU
5.0000 mg | INHALATION_SOLUTION | Freq: Once | RESPIRATORY_TRACT | Status: AC
  Administered 2012-12-13: 5 mg via RESPIRATORY_TRACT
  Filled 2012-12-13: qty 1

## 2012-12-13 MED ORDER — ALBUTEROL SULFATE HFA 108 (90 BASE) MCG/ACT IN AERS
2.0000 | INHALATION_SPRAY | Freq: Once | RESPIRATORY_TRACT | Status: AC
  Administered 2012-12-13: 2 via RESPIRATORY_TRACT
  Filled 2012-12-13: qty 6.7

## 2012-12-13 MED ORDER — PREDNISONE 20 MG PO TABS
60.0000 mg | ORAL_TABLET | Freq: Once | ORAL | Status: AC
  Administered 2012-12-13: 60 mg via ORAL
  Filled 2012-12-13: qty 3

## 2012-12-13 MED ORDER — IPRATROPIUM BROMIDE 0.02 % IN SOLN
0.5000 mg | Freq: Once | RESPIRATORY_TRACT | Status: AC
  Administered 2012-12-13: 0.5 mg via RESPIRATORY_TRACT
  Filled 2012-12-13: qty 2.5

## 2012-12-13 NOTE — ED Notes (Addendum)
Pt has a hx of asthma. Uses inhaler as well as nebulizer daily. Is currently out of inhaler. Pt is in no distress.

## 2012-12-13 NOTE — ED Provider Notes (Signed)
CSN: [id number]     Arrival date & time 12/13/12  1416 History  This chart was scribed for non-physician practitioner Norval Morton, PA-C working with Audree Camel, MD by Valera Castle, ED scribe. This patient was seen in room TR07C/TR07C and the patient's care was started at 3:48 PM.    Chief Complaint  Patient presents with  . Asthma   The history is provided by the patient and medical records. No language interpreter was used.   HPI Comments: Bryan Wilkinson is a 32 y.o. male who presents to the Emergency Department complaining of sudden, moderate, intermittent SOB, onset 3 days ago. He reports an associated scratchy sore throat, sinus congestion, sneezing, rhinorrhea, and chest tightness, onset yesterday. He reports he usually uses his inhaler and a nebulizer for his h/o asthma, and reports having taken the nebulizer only for this flare up, with some relief. He is out of his MDI. He reports his asthma only flares up a few times a year. He denies cough, fever, chills, ear pain, nausea, emesis, and any other associated symptoms. He reports that he has been told to keep his BP down, but takes no medications for this. He denies h/o DM, and denies any other medical history.   He denies having insurance.  PCP - No PCP Per Patient    Past Medical History  Diagnosis Date  . Asthma    History reviewed. No pertinent past surgical history. History reviewed. No pertinent family history. History  Substance Use Topics  . Smoking status: Current Some Day Smoker  . Smokeless tobacco: Never Used  . Alcohol Use: 1.8 oz/week    3 Cans of beer per week     Comment: RARELY    Review of Systems  Constitutional: Positive for fatigue. Negative for fever, chills and appetite change.  HENT: Positive for congestion, postnasal drip, rhinorrhea, sinus pressure and sore throat. Negative for ear discharge, ear pain and mouth sores.   Eyes: Negative for visual disturbance.  Respiratory: Positive for  cough, chest tightness, shortness of breath and wheezing. Negative for stridor.   Cardiovascular: Negative for chest pain, palpitations and leg swelling.  Gastrointestinal: Negative for nausea, vomiting, abdominal pain and diarrhea.  Genitourinary: Negative for dysuria, urgency, frequency and hematuria.  Musculoskeletal: Negative for arthralgias, back pain, myalgias and neck stiffness.  Skin: Negative for rash.  Neurological: Negative for syncope, light-headedness, numbness and headaches.  Hematological: Negative for adenopathy.  Psychiatric/Behavioral: The patient is not nervous/anxious.   All other systems reviewed and are negative.    Allergies  Other and Shellfish allergy  Home Medications   Current Outpatient Rx  Name  Route  Sig  Dispense  Refill  . albuterol (PROVENTIL HFA;VENTOLIN HFA) 108 (90 BASE) MCG/ACT inhaler   Inhalation   Inhale 2 puffs into the lungs every 6 (six) hours as needed for wheezing.         Marland Kitchen albuterol (PROVENTIL) (5 MG/ML) 0.5% nebulizer solution   Nebulization   Take 2.5 mg by nebulization every 6 (six) hours as needed. For wheezing         . Doxylamine Succinate, Sleep, (SLEEP AID PO)   Oral   Take 1 tablet by mouth at bedtime as needed (for sleep).         Marland Kitchen albuterol (PROVENTIL HFA;VENTOLIN HFA) 108 (90 BASE) MCG/ACT inhaler   Inhalation   Inhale 2 puffs into the lungs every 4 (four) hours as needed for wheezing or shortness of breath.   1 Inhaler  3   . albuterol (PROVENTIL) (2.5 MG/3ML) 0.083% nebulizer solution   Nebulization   Take 3 mLs (2.5 mg total) by nebulization every 4 (four) hours as needed for wheezing.   30 vial   0   . cetirizine (ZYRTEC ALLERGY) 10 MG tablet   Oral   Take 1 tablet (10 mg total) by mouth daily.   30 tablet   1   . fluticasone (FLONASE) 50 MCG/ACT nasal spray   Nasal   Place 2 sprays into the nose daily.   16 g   0   . predniSONE (DELTASONE) 20 MG tablet   Oral   Take 2 tablets (40 mg  total) by mouth daily.   10 tablet   0    Triage Vitals: BP 155/91  Pulse 91  Temp(Src) 98.8 F (37.1 C) (Oral)  Resp 18  Ht 6\' 1"  (1.854 m)  Wt 252 lb 5 oz (114.448 kg)  BMI 33.3 kg/m2  SpO2 96%  Physical Exam  Nursing note and vitals reviewed. Constitutional: He is oriented to person, place, and time. He appears well-developed and well-nourished. No distress.  Awake, alert, nontoxic appearance  HENT:  Head: Normocephalic and atraumatic.  Right Ear: Tympanic membrane, external ear and ear canal normal.  Left Ear: Tympanic membrane, external ear and ear canal normal.  Nose: Mucosal edema and rhinorrhea present. No epistaxis. Right sinus exhibits no maxillary sinus tenderness and no frontal sinus tenderness. Left sinus exhibits no maxillary sinus tenderness and no frontal sinus tenderness.  Mouth/Throat: Uvula is midline and mucous membranes are normal. Mucous membranes are not pale and not cyanotic. Posterior oropharyngeal erythema present. No oropharyngeal exudate, posterior oropharyngeal edema or tonsillar abscesses.  Mild erythema of the oral pharyngeal cavity, no edema or exudate  Eyes: Conjunctivae are normal. Pupils are equal, round, and reactive to light. No scleral icterus.  Neck: Normal range of motion and full passive range of motion without pain. Neck supple.  Cardiovascular: Normal rate, regular rhythm, normal heart sounds and intact distal pulses.   No murmur heard. Pulmonary/Chest: Effort normal. No accessory muscle usage or stridor. Not tachypneic. No respiratory distress. He has decreased breath sounds. He has no wheezes. He has no rhonchi. He has no rales. He exhibits no tenderness and no bony tenderness.  Decreased breath sounds. No rales or rhonchi  Abdominal: Soft. Bowel sounds are normal. He exhibits no mass. There is no tenderness. There is no rebound and no guarding.  Musculoskeletal: Normal range of motion. He exhibits no edema.  Lymphadenopathy:    He has  no cervical adenopathy.  Neurological: He is alert and oriented to person, place, and time.  Speech is clear and goal oriented Moves extremities without ataxia  Skin: Skin is warm and dry. No rash noted. He is not diaphoretic.  Psychiatric: He has a normal mood and affect.    ED Course  Procedures (including critical care time)  DIAGNOSTIC STUDIES: Oxygen Saturation is 96% on room air, normal by my interpretation.    COORDINATION OF CARE: 3:50 PM-Discussed treatment plan which includes a breathing treatment with pt at bedside and pt agreed to plan.   Labs Review Labs Reviewed - No data to display Imaging Review No results found.  EKG Interpretation   None      Meds ordered this encounter  Medications  . predniSONE (DELTASONE) tablet 60 mg    Sig:   . albuterol (PROVENTIL) (5 MG/ML) 0.5% nebulizer solution 5 mg    Sig:   .  ipratropium (ATROVENT) nebulizer solution 0.5 mg    Sig:   . albuterol (PROVENTIL HFA;VENTOLIN HFA) 108 (90 BASE) MCG/ACT inhaler 2 puff    Sig:   . albuterol (PROVENTIL HFA;VENTOLIN HFA) 108 (90 BASE) MCG/ACT inhaler    Sig: Inhale 2 puffs into the lungs every 4 (four) hours as needed for wheezing or shortness of breath.    Dispense:  1 Inhaler    Refill:  3    Order Specific Question:  Supervising Provider    Answer:  Eber Hong D [[id number]]  . predniSONE (DELTASONE) 20 MG tablet    Sig: Take 2 tablets (40 mg total) by mouth daily.    Dispense:  10 tablet    Refill:  0    Order Specific Question:  Supervising Provider    Answer:  Eber Hong D [[id number]]  . cetirizine (ZYRTEC ALLERGY) 10 MG tablet    Sig: Take 1 tablet (10 mg total) by mouth daily.    Dispense:  30 tablet    Refill:  1    Order Specific Question:  Supervising Provider    Answer:  Eber Hong D [[id number]]  . fluticasone (FLONASE) 50 MCG/ACT nasal spray    Sig: Place 2 sprays into the nose daily.    Dispense:  16 g    Refill:  0    Order Specific Question:  Supervising Provider     Answer:  Eber Hong D [[id number]]  . albuterol (PROVENTIL) (2.5 MG/3ML) 0.083% nebulizer solution    Sig: Take 3 mLs (2.5 mg total) by nebulization every 4 (four) hours as needed for wheezing.    Dispense:  30 vial    Refill:  0    Order Specific Question:  Supervising Provider    Answer:  Eber Hong D [[id number]]     MDM   1. Asthma exacerbation, mild   2. Viral URI    Daegon D Belanger presents with mild asthma exacerbation and URI symptoms.  Patients symptoms are of likely viral etiology. Discussed that antibiotics are not indicated for viral infections. Will give albuterol nebulizer here in the emergency department and reassess.  4:51 PM Patient with improved breath sounds, very mild expiratory wheezes now heard throughout.  Patient ambulated in ED with O2 saturations maintained >90, no current signs of respiratory distress. Lung exam improved after nebulizer treatment. Prednisone given in the ED and pt will bd dc with 5 day burst. Pt states they are breathing at baseline. Pt has been instructed to continue using prescribed medications and followup with the  and wellness clinic for asthma management.  Pt is hemodynamically stable & in NAD prior to dc.  It has been determined that no acute conditions requiring further emergency intervention are present at this time. The patient/guardian have been advised of the diagnosis and plan. We have discussed signs and symptoms that warrant return to the ED, such as changes or worsening in symptoms.   Vital signs are stable at discharge.   BP 129/83  Pulse 77  Temp(Src) 98.8 F (37.1 C) (Oral)  Resp 18  Ht 6\' 1"  (1.854 m)  Wt 252 lb 5 oz (114.448 kg)  BMI 33.3 kg/m2  SpO2 98%  Patient/guardian has voiced understanding and agreed to follow-up with the PCP or specialist.    I personally performed the services described in this documentation, which was scribed in my presence. The recorded information has been reviewed and is  accurate.    Dahlia Client Mossie Gilder, PA-C 12/13/12 1652

## 2012-12-13 NOTE — ED Notes (Signed)
Pt c/o asthma flare x 2 days; pt sts out of his inhaler; no distress noted at present; pt sts some cough

## 2012-12-13 NOTE — ED Notes (Signed)
Treatment complete.

## 2012-12-14 NOTE — ED Provider Notes (Signed)
Medical screening examination/treatment/procedure(s) were performed by non-physician practitioner and as supervising physician I was immediately available for consultation/collaboration.  EKG Interpretation   None         Nazly Digilio T Otto Caraway, MD 12/14/12 0133 

## 2012-12-22 ENCOUNTER — Emergency Department (HOSPITAL_COMMUNITY)
Admission: EM | Admit: 2012-12-22 | Discharge: 2012-12-22 | Disposition: A | Discharge status: Home / Self Care | Payer: Self-pay | Attending: Emergency Medicine | Admitting: Emergency Medicine

## 2012-12-22 ENCOUNTER — Encounter (HOSPITAL_COMMUNITY): Payer: Self-pay | Admitting: Emergency Medicine

## 2012-12-22 DIAGNOSIS — F17200 Nicotine dependence, unspecified, uncomplicated: Secondary | ICD-10-CM | POA: Insufficient documentation

## 2012-12-22 DIAGNOSIS — J45909 Unspecified asthma, uncomplicated: Secondary | ICD-10-CM

## 2012-12-22 DIAGNOSIS — Z79899 Other long term (current) drug therapy: Secondary | ICD-10-CM | POA: Insufficient documentation

## 2012-12-22 DIAGNOSIS — J45901 Unspecified asthma with (acute) exacerbation: Secondary | ICD-10-CM | POA: Insufficient documentation

## 2012-12-22 MED ORDER — ALBUTEROL SULFATE HFA 108 (90 BASE) MCG/ACT IN AERS
2.0000 | INHALATION_SPRAY | RESPIRATORY_TRACT | Status: DC
  Administered 2012-12-22: 2 via RESPIRATORY_TRACT
  Filled 2012-12-22: qty 6.7

## 2012-12-22 MED ORDER — PREDNISONE 10 MG PO TABS: 20.0000 mg | ORAL_TABLET | Freq: Every day | ORAL | Status: DC

## 2012-12-22 MED ORDER — ALBUTEROL SULFATE (5 MG/ML) 0.5% IN NEBU
5.0000 mg | INHALATION_SOLUTION | Freq: Once | RESPIRATORY_TRACT | Status: AC
  Administered 2012-12-22: 5 mg via RESPIRATORY_TRACT
  Filled 2012-12-22: qty 1

## 2012-12-22 MED ORDER — PREDNISONE 20 MG PO TABS
60.0000 mg | ORAL_TABLET | Freq: Once | ORAL | Status: AC
  Administered 2012-12-22: 60 mg via ORAL
  Filled 2012-12-22: qty 3

## 2012-12-22 MED ORDER — IPRATROPIUM BROMIDE 0.02 % IN SOLN
0.5000 mg | Freq: Once | RESPIRATORY_TRACT | Status: AC
  Administered 2012-12-22: 0.5 mg via RESPIRATORY_TRACT
  Filled 2012-12-22: qty 2.5

## 2012-12-22 NOTE — Progress Notes (Signed)
[facility] provided patient with a list of primary care resources and a [employer].

## 2012-12-22 NOTE — ED Provider Notes (Signed)
CSN: [id number]     Arrival date & time 12/22/12  1039 History   First MD Initiated Contact with Patient 12/22/12 1044     Chief Complaint  Patient presents with  . Asthma   (Consider location/radiation/quality/duration/timing/severity/associated sxs/prior Treatment) Patient is a 32 y.o. male presenting with asthma. The history is provided by the patient.  Asthma   patient here timing of asthma exacerbation x24 hours. Notes increased cough with wheezing and no fever. Denies any anginal type chest pain. No vomiting or diarrhea. Patient has run out of his inhaler and doesn't smoke cigarettes. Symptoms are worse at night and nothing makes them better. No new treatments used prior to arrival. Denies any ear pain or sore throat. No headache or photophobia.  Past Medical History  Diagnosis Date  . Asthma    History reviewed. No pertinent past surgical history. No family history on file. History  Substance Use Topics  . Smoking status: Current Some Day Smoker  . Smokeless tobacco: Never Used  . Alcohol Use: 1.8 oz/week    3 Cans of beer per week     Comment: RARELY    Review of Systems  All other systems reviewed and are negative.    Allergies  Other and Shellfish allergy  Home Medications   Current Outpatient Rx  Name  Route  Sig  Dispense  Refill  . albuterol (PROVENTIL HFA;VENTOLIN HFA) 108 (90 BASE) MCG/ACT inhaler   Inhalation   Inhale 2 puffs into the lungs every 6 (six) hours as needed for wheezing or shortness of breath.          Marland Kitchen albuterol (PROVENTIL) (2.5 MG/3ML) 0.083% nebulizer solution   Nebulization   Take 3 mLs (2.5 mg total) by nebulization every 4 (four) hours as needed for wheezing.   30 vial   0    BP 140/85  Pulse 99  Temp(Src) 98.3 F (36.8 C) (Oral)  Resp 20  SpO2 97% Physical Exam  Nursing note and vitals reviewed. Constitutional: He is oriented to person, place, and time. He appears well-developed and well-nourished.  Non-toxic  appearance. No distress.  HENT:  Head: Normocephalic and atraumatic.  Eyes: Conjunctivae, EOM and lids are normal. Pupils are equal, round, and reactive to light.  Neck: Normal range of motion. Neck supple. No tracheal deviation present. No mass present.  Cardiovascular: Normal rate, regular rhythm and normal heart sounds.  Exam reveals no gallop.   No murmur heard. Pulmonary/Chest: Effort normal. No stridor. No respiratory distress. He has decreased breath sounds. He has wheezes. He has no rhonchi. He has no rales.  Abdominal: Soft. Normal appearance and bowel sounds are normal. He exhibits no distension. There is no tenderness. There is no rebound and no CVA tenderness.  Musculoskeletal: Normal range of motion. He exhibits no edema and no tenderness.  Neurological: He is alert and oriented to person, place, and time. He has normal strength. No cranial nerve deficit or sensory deficit. GCS eye subscore is 4. GCS verbal subscore is 5. GCS motor subscore is 6.  Skin: Skin is warm and dry. No abrasion and no rash noted.  Psychiatric: He has a normal mood and affect. His speech is normal and behavior is normal.    ED Course  Procedures (including critical care time) Labs Review Labs Reviewed - No data to display Imaging Review No results found.  EKG Interpretation   None       MDM  No diagnosis found. Patient given albuterol and prednisone and feels  better. Wheezing has improved and patient be placed on prednisone along with given an albuterol inhaler    Toy Baker, MD 12/22/12 1313

## 2012-12-22 NOTE — ED Notes (Signed)
Per pt history of ashtma-no relief from neb tx-does not have inhaler-symptoms started last night

## 2013-01-09 ENCOUNTER — Ambulatory Visit: Payer: Self-pay

## 2013-01-09 ENCOUNTER — Ambulatory Visit: Payer: Self-pay | Attending: Internal Medicine | Admitting: Internal Medicine

## 2013-01-09 VITALS — BP 147/93 | HR 84 | Temp 99.0°F | Resp 16 | Ht 70.0 in | Wt 262.0 lb

## 2013-01-09 DIAGNOSIS — J45909 Unspecified asthma, uncomplicated: Secondary | ICD-10-CM | POA: Insufficient documentation

## 2013-01-09 DIAGNOSIS — I10 Essential (primary) hypertension: Secondary | ICD-10-CM | POA: Insufficient documentation

## 2013-01-09 MED ORDER — AMLODIPINE BESYLATE 2.5 MG PO TABS: 2.5000 mg | ORAL_TABLET | Freq: Every day | ORAL | Status: DC

## 2013-01-09 MED ORDER — ALBUTEROL SULFATE (2.5 MG/3ML) 0.083% IN NEBU: 2.5000 mg | INHALATION_SOLUTION | Freq: Four times a day (QID) | RESPIRATORY_TRACT | Status: DC | PRN

## 2013-01-09 NOTE — Progress Notes (Signed)
Patient ID: Bryan Wilkinson, male   DOB: 15-Dec-1980, 32 y.o.   MRN: [id number]  CC: follow up  HPI: 32 year old male with past medical history of asthma, uses only inhalers or nebulizer treatments. Patient came to the clinic for followup. No complaints of shortness of breath or chest tightness. No coughs or fevers.  Allergies  Allergen Reactions  . Other Other (See Comments)    Certain fruit irritate throat slightly. Banana, Cantaloupe, Watermelon.  . Shellfish Allergy Other (See Comments)    Slight irritation of throat   Past Medical History  Diagnosis Date  . Asthma    Current Outpatient Prescriptions on File Prior to Visit  Medication Sig Dispense Refill  . albuterol (PROVENTIL HFA;VENTOLIN HFA) 108 (90 BASE) MCG/ACT inhaler Inhale 2 puffs into the lungs every 6 (six) hours as needed for wheezing or shortness of breath.       Marland Kitchen albuterol (PROVENTIL) (2.5 MG/3ML) 0.083% nebulizer solution Take 3 mLs (2.5 mg total) by nebulization every 4 (four) hours as needed for wheezing.  30 vial  0  . predniSONE (DELTASONE) 10 MG tablet Take 2 tablets (20 mg total) by mouth daily.  10 tablet  0   No current facility-administered medications on file prior to visit.   Family History  Problem Relation Age of Onset  . Asthma Father   . Diabetes Maternal Uncle    History   Social History  . Marital Status: Single    Spouse Name: N/A    Number of Children: N/A  . Years of Education: N/A   Occupational History  . Not on file.   Social History Main Topics  . Smoking status: Former Smoker    Quit date: 12/09/2012  . Smokeless tobacco: Never Used  . Alcohol Use: 1.8 oz/week    3 Cans of beer per week     Comment: RARELY  . Drug Use: No  . Sexual Activity: Yes   Other Topics Concern  . Not on file   Social History Narrative  . No narrative on file    Review of Systems  Constitutional: Negative for fever, chills, diaphoresis, activity change, appetite change and fatigue.  HENT:  Negative for ear pain, nosebleeds, congestion, facial swelling, rhinorrhea, neck pain, neck stiffness and ear discharge.   Eyes: Negative for pain, discharge, redness, itching and visual disturbance.  Respiratory: Negative for cough, choking, chest tightness, shortness of breath, wheezing and stridor.   Cardiovascular: Negative for chest pain, palpitations and leg swelling.  Gastrointestinal: Negative for abdominal distention.  Genitourinary: Negative for dysuria, urgency, frequency, hematuria, flank pain, decreased urine volume, difficulty urinating and dyspareunia.  Musculoskeletal: Negative for back pain, joint swelling, arthralgias and gait problem.  Neurological: Negative for dizziness, tremors, seizures, syncope, facial asymmetry, speech difficulty, weakness, light-headedness, numbness and headaches.  Hematological: Negative for adenopathy. Does not bruise/bleed easily.  Psychiatric/Behavioral: Negative for hallucinations, behavioral problems, confusion, dysphoric mood, decreased concentration and agitation.    Objective:   Filed Vitals:   01/09/13 1500  BP: 147/93  Pulse: 84  Temp: 99 F (37.2 C)  Resp: 16    Physical Exam  Constitutional: Appears well-developed and well-nourished. No distress.  HENT: Normocephalic. External right and left ear normal. Oropharynx is clear and moist.  Eyes: Conjunctivae and EOM are normal. PERRLA, no scleral icterus.  Neck: Normal ROM. Neck supple. No JVD. No tracheal deviation. No thyromegaly.  CVS: RRR, S1/S2 +, no murmurs, no gallops, no carotid bruit.  Pulmonary: Effort and breath sounds normal, no  stridor, rhonchi, wheezes, rales.  Abdominal: Soft. BS +,  no distension, tenderness, rebound or guarding.  Musculoskeletal: Normal range of motion. No edema and no tenderness.  Lymphadenopathy: No lymphadenopathy noted, cervical, inguinal. Neuro: Alert. Normal reflexes, muscle tone coordination. No cranial nerve deficit. Skin: Skin is warm and  dry. No rash noted. Not diaphoretic. No erythema. No pallor.  Psychiatric: Normal mood and affect. Behavior, judgment, thought content normal.   Lab Results  Component Value Date   WBC 9.0 10/17/2012   HGB 15.1 10/17/2012   HCT 42.1 10/17/2012   MCV 90.3 10/17/2012   PLT 228 10/17/2012   Lab Results  Component Value Date   CREATININE 0.94 10/17/2012   BUN 10 10/17/2012   NA 140 10/17/2012   K 3.8 10/17/2012   CL 103 10/17/2012   CO2 25 10/17/2012    No results found for this basename: HGBA1C   Lipid Panel  No results found for this basename: chol, trig, hdl, cholhdl, vldl, ldlcalc       Assessment and plan:   Patient Active Problem List   Diagnosis Date Noted  . RAD (reactive airway disease) with wheezing 01/09/2013    Priority: Medium - continue albuterol inhaler and nebulizer treatments as needed every 4-6 hours PRN  . HTN (hypertension) 01/09/2013    Priority: Medium - start Norvasc 2.5 mg daily

## 2013-01-09 NOTE — Progress Notes (Signed)
Pt is here to establich care. Pt is here to manage his HTN and asthma. Pt is requesting a referral to a dentist.

## 2013-01-09 NOTE — Patient Instructions (Signed)
Asthma, Adult Asthma is a recurring condition in which the airways tighten and narrow. Asthma can make it difficult to breathe. It can cause coughing, wheezing, and shortness of breath. Asthma episodes (also called asthma attacks) range from minor to life-threatening. Asthma cannot be cured, but medicines and lifestyle changes can help control it. CAUSES Asthma is believed to be caused by inherited (genetic) and environmental factors, but its exact cause is unknown. Asthma may be triggered by allergens, lung infections, or irritants in the air. Asthma triggers are different for each person. Common triggers include:   Animal dander.  Dust mites.  Cockroaches.  Pollen from trees or grass.  Mold.  Smoke.  Air pollutants such as dust, household cleaners, hair sprays, aerosol sprays, paint fumes, strong chemicals, or strong odors.  Cold air, weather changes, and winds (which increase molds and pollens in the air).  Strong emotional expressions such as crying or laughing hard.  Stress.  Certain medicines (such as aspirin) or types of drugs (such as beta-blockers).  Sulfites in foods and drinks. Foods and drinks that may contain sulfites include dried fruit, potato chips, and sparkling grape juice.  Infections or inflammatory conditions such as the flu, a cold, or an inflammation of the nasal membranes (rhinitis).  Gastroesophageal reflux disease (GERD).  Exercise or strenuous activity. SYMPTOMS Symptoms may occur immediately after asthma is triggered or many hours later. Symptoms include:  Wheezing.  Excessive nighttime or early morning coughing.  Frequent or severe coughing with a common cold.  Chest tightness.  Shortness of breath. DIAGNOSIS  The diagnosis of asthma is made by a review of your medical history and a physical exam. Tests may also be performed. These may include:  Lung function studies. These tests show how much air you breath in and out.  Allergy  tests.  Imaging tests such as X-rays. TREATMENT  Asthma cannot be cured, but it can usually be controlled. Treatment involves identifying and avoiding your asthma triggers. It also involves medicines. There are 2 classes of medicine used for asthma treatment:   Controller medicines. These prevent asthma symptoms from occurring. They are usually taken every day.  Reliever or rescue medicines. These quickly relieve asthma symptoms. They are used as needed and provide short-term relief. Your health care provider will help you create an asthma action plan. An asthma action plan is a written plan for managing and treating your asthma attacks. It includes a list of your asthma triggers and how they may be avoided. It also includes information on when medicines should be taken and when their dosage should be changed. An action plan may also involve the use of a device called a peak flow meter. A peak flow meter measures how well the lungs are working. It helps you monitor your condition. HOME CARE INSTRUCTIONS   Take medicine as directed by your health care provider. Speak with your health care provider if you have questions about how or when to take the medicines.  Use a peak flow meter as directed by your health care provider. Record and keep track of readings.  Understand and use the action plan to help minimize or stop an asthma attack without needing to seek medical care.  Control your home environment in the following ways to help prevent asthma attacks:  Do not smoke. Avoid being exposed to secondhand smoke.  Change your heating and air conditioning filter regularly.  Limit your use of fireplaces and wood stoves.  Get rid of pests (such as roaches and   mice) and their droppings.  Throw away plants if you see mold on them.  Clean your floors and dust regularly. Use unscented cleaning products.  Try to have someone else vacuum for you regularly. Stay out of rooms while they are being  vacuumed and for a short while afterward. If you vacuum, use a dust mask from a hardware store, a double-layered or microfilter vacuum cleaner bag, or a vacuum cleaner with a HEPA filter.  Replace carpet with wood, tile, or vinyl flooring. Carpet can trap dander and dust.  Use allergy-proof pillows, mattress covers, and box spring covers.  Wash bed sheets and blankets every week in hot water and dry them in a dryer.  Use blankets that are made of polyester or cotton.  Clean bathrooms and kitchens with bleach. If possible, have someone repaint the walls in these rooms with mold-resistant paint. Keep out of the rooms that are being cleaned and painted.  Wash hands frequently. SEEK MEDICAL CARE IF:   You have wheezing, shortness of breath, or a cough even if taking medicine to prevent attacks.  The colored mucus you cough up (sputum) is thicker than usual.  Your sputum changes from clear or white to yellow, green, gray, or bloody.  You have any problems that may be related to the medicines you are taking (such as a rash, itching, swelling, or trouble breathing).  You are using a reliever medicine more than 2 3 times per week.  Your peak flow is still at 50 79% of you personal best after following your action plan for 1 hour. SEEK IMMEDIATE MEDICAL CARE IF:   You seem to be getting worse and are unresponsive to treatment during an asthma attack.  You are short of breath even at rest.  You get short of breath when doing very little physical activity.  You have difficulty eating, drinking, or talking due to asthma symptoms.  You develop chest pain.  You develop a fast heartbeat.  You have a bluish color to your lips or fingernails.  You are lightheaded, dizzy, or faint.  Your peak flow is less than 50% of your personal best.  You have a fever or persistent symptoms for more than 2 3 days.  You have a fever and symptoms suddenly get worse. MAKE SURE YOU:   Understand these  instructions.  Will watch your condition.  Will get help right away if you are not doing well or get worse. Document Released: 02/01/2005 Document Revised: 10/04/2012 Document Reviewed: 08/31/2012 [employer] Patient Information 2014 [employer].  

## 2013-01-10 ENCOUNTER — Ambulatory Visit: Payer: Self-pay

## 2013-01-17 ENCOUNTER — Encounter (HOSPITAL_COMMUNITY): Payer: Self-pay | Admitting: Emergency Medicine

## 2013-01-17 ENCOUNTER — Emergency Department (HOSPITAL_COMMUNITY)
Admission: EM | Admit: 2013-01-17 | Discharge: 2013-01-17 | Disposition: A | Discharge status: Home / Self Care | Payer: Self-pay | Attending: Emergency Medicine | Admitting: Emergency Medicine

## 2013-01-17 DIAGNOSIS — J45901 Unspecified asthma with (acute) exacerbation: Secondary | ICD-10-CM | POA: Insufficient documentation

## 2013-01-17 DIAGNOSIS — Z87891 Personal history of nicotine dependence: Secondary | ICD-10-CM | POA: Insufficient documentation

## 2013-01-17 DIAGNOSIS — Z79899 Other long term (current) drug therapy: Secondary | ICD-10-CM | POA: Insufficient documentation

## 2013-01-17 DIAGNOSIS — I10 Essential (primary) hypertension: Secondary | ICD-10-CM

## 2013-01-17 MED ORDER — ALBUTEROL SULFATE (5 MG/ML) 0.5% IN NEBU
5.0000 mg | INHALATION_SOLUTION | Freq: Once | RESPIRATORY_TRACT | Status: AC
  Administered 2013-01-17: 5 mg via RESPIRATORY_TRACT
  Filled 2013-01-17: qty 1

## 2013-01-17 MED ORDER — PREDNISONE 20 MG PO TABS: ORAL_TABLET | ORAL | Status: DC

## 2013-01-17 MED ORDER — PREDNISONE 20 MG PO TABS
40.0000 mg | ORAL_TABLET | Freq: Once | ORAL | Status: AC
  Administered 2013-01-17: 40 mg via ORAL
  Filled 2013-01-17: qty 2

## 2013-01-17 MED ORDER — ALBUTEROL SULFATE HFA 108 (90 BASE) MCG/ACT IN AERS
2.0000 | INHALATION_SPRAY | Freq: Once | RESPIRATORY_TRACT | Status: AC
  Administered 2013-01-17: 2 via RESPIRATORY_TRACT
  Filled 2013-01-17: qty 6.7

## 2013-01-17 MED ORDER — IPRATROPIUM BROMIDE 0.02 % IN SOLN
0.5000 mg | Freq: Once | RESPIRATORY_TRACT | Status: AC
  Administered 2013-01-17: 0.5 mg via RESPIRATORY_TRACT
  Filled 2013-01-17: qty 2.5

## 2013-01-17 NOTE — ED Notes (Signed)
Pt c/o shob onset last night, hx of asthma, out of MDI.

## 2013-01-17 NOTE — ED Provider Notes (Signed)
CSN: [id number]     Arrival date & time 01/17/13  2020 History   First MD Initiated Contact with Patient 01/17/13 2102     Chief Complaint  Patient presents with  . Asthma   (Consider location/radiation/quality/duration/timing/severity/associated sxs/prior Treatment) HPI Comments: 32 yo male with asthma hx, past smoker presents with wheezing and breathing difficulty since yesterday evening.  Mild, similar to previous. No current abx or prednisone. Improved with time.  No blood clot hx or cp.  Pt out of albuterol.   Patient is a 32 y.o. male presenting with asthma. The history is provided by the patient.  Asthma This is a recurrent problem. Associated symptoms include shortness of breath. Pertinent negatives include no chest pain, no abdominal pain and no headaches.    Past Medical History  Diagnosis Date  . Asthma    History reviewed. No pertinent past surgical history. Family History  Problem Relation Age of Onset  . Asthma Father   . Diabetes Maternal Uncle    History  Substance Use Topics  . Smoking status: Former Smoker    Quit date: 12/09/2012  . Smokeless tobacco: Never Used  . Alcohol Use: 1.8 oz/week    3 Cans of beer per week     Comment: RARELY    Review of Systems  Constitutional: Negative for fever and chills.  HENT: Negative for congestion.   Respiratory: Positive for shortness of breath and wheezing.   Cardiovascular: Negative for chest pain.  Gastrointestinal: Negative for vomiting and abdominal pain.  Musculoskeletal: Negative for back pain, neck pain and neck stiffness.  Skin: Negative for rash.  Neurological: Negative for light-headedness and headaches.    Allergies  Other and Shellfish allergy  Home Medications   Current Outpatient Rx  Name  Route  Sig  Dispense  Refill  . albuterol (PROVENTIL HFA;VENTOLIN HFA) 108 (90 BASE) MCG/ACT inhaler   Inhalation   Inhale 2 puffs into the lungs every 6 (six) hours as needed for wheezing or shortness of  breath.          Marland Kitchen albuterol (PROVENTIL) (2.5 MG/3ML) 0.083% nebulizer solution   Nebulization   Take 3 mLs (2.5 mg total) by nebulization every 6 (six) hours as needed for wheezing or shortness of breath.   150 mL   12   . amLODipine (NORVASC) 2.5 MG tablet   Oral   Take 1 tablet (2.5 mg total) by mouth daily.   30 tablet   3    BP 158/98  Pulse 78  Temp(Src) 98.7 F (37.1 C) (Oral)  Resp 18  Ht 5\' 11"  (1.803 m)  Wt 265 lb (120.203 kg)  BMI 36.98 kg/m2  SpO2 95% Physical Exam  Nursing note and vitals reviewed. Constitutional: He is oriented to person, place, and time. He appears well-developed and well-nourished.  HENT:  Head: Normocephalic and atraumatic.  Eyes: Conjunctivae are normal. Right eye exhibits no discharge. Left eye exhibits no discharge.  Neck: Normal range of motion. Neck supple. No tracheal deviation present.  Cardiovascular: Normal rate and regular rhythm.   Pulmonary/Chest: Effort normal. He has wheezes (exp bilateral).  Abdominal: Soft. He exhibits no distension. There is no tenderness. There is no guarding.  Musculoskeletal: He exhibits no edema.  Neurological: He is alert and oriented to person, place, and time.  Skin: Skin is warm. No rash noted.  Psychiatric: He has a normal mood and affect.    ED Course  Procedures (including critical care time) Labs Review Labs Reviewed - No  data to display Imaging Review No results found.  EKG Interpretation   None       MDM  No diagnosis found. Clinically asthma exac. Duoneb, inhaler.  Steroids. Pt improved in ED.  Discussed fup for htn and importance of control.  Results and differential diagnosis were discussed with the patient. Close follow up outpatient was discussed, patient comfortable with the plan.   Diagnosis: acute asthma exacerbation, htn   Enid Skeens, MD 01/17/13 2326

## 2013-01-18 ENCOUNTER — Telehealth: Payer: Self-pay | Admitting: Emergency Medicine

## 2013-01-18 ENCOUNTER — Telehealth: Payer: Self-pay | Admitting: Internal Medicine

## 2013-01-18 MED ORDER — ALBUTEROL SULFATE HFA 108 (90 BASE) MCG/ACT IN AERS: 2.0000 | INHALATION_SPRAY | Freq: Four times a day (QID) | RESPIRATORY_TRACT | Status: DC | PRN

## 2013-01-18 NOTE — Telephone Encounter (Signed)
Pt called regarding his medication for asthma, Pt states that he was suppose to receive one script for an inhaler and another for his nebulizer solution, but both his scripts are for the nebulizer solution. Please contact pt

## 2013-01-18 NOTE — Telephone Encounter (Signed)
HFA ordered and e-scribed to [facility] pharmacy

## 2013-01-19 ENCOUNTER — Other Ambulatory Visit: Payer: Self-pay | Admitting: Internal Medicine

## 2013-01-19 MED ORDER — ALBUTEROL SULFATE HFA 108 (90 BASE) MCG/ACT IN AERS: 2.0000 | INHALATION_SPRAY | Freq: Four times a day (QID) | RESPIRATORY_TRACT | Status: DC | PRN

## 2013-01-24 ENCOUNTER — Other Ambulatory Visit: Payer: Self-pay | Admitting: Internal Medicine

## 2013-01-24 MED ORDER — ALBUTEROL SULFATE HFA 108 (90 BASE) MCG/ACT IN AERS: 2.0000 | INHALATION_SPRAY | Freq: Four times a day (QID) | RESPIRATORY_TRACT | Status: DC | PRN

## 2013-02-07 ENCOUNTER — Ambulatory Visit: Payer: Self-pay | Admitting: Internal Medicine

## 2013-02-12 ENCOUNTER — Emergency Department (HOSPITAL_COMMUNITY)
Admission: EM | Admit: 2013-02-12 | Discharge: 2013-02-12 | Disposition: A | Discharge status: Home / Self Care | Payer: Self-pay | Attending: Emergency Medicine | Admitting: Emergency Medicine

## 2013-02-12 ENCOUNTER — Encounter (HOSPITAL_COMMUNITY): Payer: Self-pay | Admitting: Emergency Medicine

## 2013-02-12 DIAGNOSIS — J45909 Unspecified asthma, uncomplicated: Secondary | ICD-10-CM

## 2013-02-12 DIAGNOSIS — R0602 Shortness of breath: Secondary | ICD-10-CM | POA: Insufficient documentation

## 2013-02-12 DIAGNOSIS — R062 Wheezing: Secondary | ICD-10-CM | POA: Insufficient documentation

## 2013-02-12 DIAGNOSIS — Z8709 Personal history of other diseases of the respiratory system: Secondary | ICD-10-CM | POA: Insufficient documentation

## 2013-02-12 DIAGNOSIS — Z87891 Personal history of nicotine dependence: Secondary | ICD-10-CM | POA: Insufficient documentation

## 2013-02-12 DIAGNOSIS — Z79899 Other long term (current) drug therapy: Secondary | ICD-10-CM | POA: Insufficient documentation

## 2013-02-12 MED ORDER — ALBUTEROL SULFATE (5 MG/ML) 0.5% IN NEBU
5.0000 mg | INHALATION_SOLUTION | Freq: Once | RESPIRATORY_TRACT | Status: AC
  Administered 2013-02-12: 5 mg via RESPIRATORY_TRACT
  Filled 2013-02-12 (×2): qty 6

## 2013-02-12 MED ORDER — ALBUTEROL SULFATE HFA 108 (90 BASE) MCG/ACT IN AERS
2.0000 | INHALATION_SPRAY | Freq: Once | RESPIRATORY_TRACT | Status: AC
  Administered 2013-02-12: 2 via RESPIRATORY_TRACT
  Filled 2013-02-12: qty 6.7

## 2013-02-12 NOTE — ED Provider Notes (Signed)
CSN: [id number]     Arrival date & time 02/12/13  1652 History  This chart was scribed for non-physician practitioner Raymon Mutton, PA-C,  working with Doug Sou, MD, by Yevette Edwards, ED Scribe. This patient was seen in room TR08C/TR08C and the patient's care was started at 9:09 PM.   First MD Initiated Contact with Patient 02/12/13 2002     Chief Complaint  Patient presents with  . Asthma   The history is provided by the patient. No language interpreter was used.   HPI Comments: Bryan Wilkinson is a 32 y.o. male, with a h/o asthma, who presents to the Emergency Department complaining of an asthmatic flare-up which began twelve hours ago and was associated with wheezing and SOB. He reports that he ran out of albuterol several days ago, and he normally uses albuterol on a daily basis. He denise chest pain, cough, nasal congestion, sore throat, fever, chills, nausea, emesis, headache, neck pain, neck stiffness, or diaphoresis. He reports he has had asthma for over 25 years. He denies any seasonal allergies.  The pt is a former smoker; he reports he stopped smoking a month ago.   He uses [facility] for PCP.   Past Medical History  Diagnosis Date  . Asthma    History reviewed. No pertinent past surgical history. Family History  Problem Relation Age of Onset  . Asthma Father   . Diabetes Maternal Uncle    History  Substance Use Topics  . Smoking status: Former Smoker    Quit date: 12/09/2012  . Smokeless tobacco: Never Used  . Alcohol Use: 1.8 oz/week    3 Cans of beer per week     Comment: RARELY    Review of Systems  Constitutional: Negative for fever, chills and diaphoresis.  HENT: Negative for congestion and sore throat.   Respiratory: Positive for shortness of breath and wheezing. Negative for cough.   Cardiovascular: Negative for chest pain.  Gastrointestinal: Negative for nausea and vomiting.  Musculoskeletal: Negative for neck pain and neck  stiffness.  Neurological: Negative for headaches.  All other systems reviewed and are negative.    Allergies  Other and Shellfish allergy  Home Medications   Current Outpatient Rx  Name  Route  Sig  Dispense  Refill  . amLODipine (NORVASC) 2.5 MG tablet   Oral   Take 1 tablet (2.5 mg total) by mouth daily.   30 tablet   3    Triage Vitals: BP 142/85  Pulse 78  Temp(Src) 98.9 F (37.2 C) (Oral)  Resp 18  Ht 5\' 11"  (1.803 m)  Wt 266 lb (120.657 kg)  BMI 37.12 kg/m2  SpO2 98%  Physical Exam  Nursing note and vitals reviewed. Constitutional: He is oriented to person, place, and time. He appears well-developed and well-nourished. No distress.  HENT:  Head: Normocephalic and atraumatic.  Mouth/Throat: Oropharynx is clear and moist. No oropharyngeal exudate.  Negative swelling, erythema, inflammation, lesions, sores, petechia, exudate noted to bilateral tonsils, posterior oropharynx. Negative trismus. Uvula midline, symmetrical elevation-negative uvula swelling.  Eyes: Conjunctivae and EOM are normal. Pupils are equal, round, and reactive to light. Right eye exhibits no discharge. Left eye exhibits no discharge.  Neck: Normal range of motion. Neck supple. No tracheal deviation present.  Cardiovascular: Normal rate, regular rhythm and normal heart sounds.  Exam reveals no friction rub.   No murmur heard. Pulses:      Radial pulses are 2+ on the right side, and 2+ on  the left side.  Cap refill less than 3 seconds  Pulmonary/Chest: Effort normal and breath sounds normal. No respiratory distress. He has no wheezes. He has no rales.  Negative use of accessory muscles Patient stable to speak in full sentences without difficulty Negative respiratory distress Airway intact   Musculoskeletal: Normal range of motion. He exhibits no tenderness.  Full ROM to upper and lower extremities without difficulty noted, negative ataxia noted  Lymphadenopathy:    He has no cervical  adenopathy.  Neurological: He is alert and oriented to person, place, and time. He exhibits normal muscle tone. Coordination normal.  Skin: Skin is warm and dry.  Psychiatric: He has a normal mood and affect. His behavior is normal.    ED Course  Procedures (including critical care time)  DIAGNOSTIC STUDIES: Oxygen Saturation is 98% on room air, normal by my interpretation.    COORDINATION OF CARE:  9:13 PM- Discussed treatment plan with patient, and the patient agreed to the plan.   Labs Review Labs Reviewed - No data to display Imaging Review No results found.  EKG Interpretation   None       MDM   1. Asthma     Medications  albuterol (PROVENTIL) (5 MG/ML) 0.5% nebulizer solution 5 mg (5 mg Nebulization Given 02/12/13 1720)  albuterol (PROVENTIL HFA;VENTOLIN HFA) 108 (90 BASE) MCG/ACT inhaler 2 puff (2 puffs Inhalation Given 02/12/13 2126)   Filed Vitals:   02/12/13 1710 02/12/13 2013 02/12/13 2128  BP: 142/85 126/85 140/87  Pulse: 78 77 76  Temp: 98.9 F (37.2 C)    TempSrc: Oral    Resp: 18 18 18   Height: 5\' 11"  (1.803 m)    Weight: 266 lb (120.657 kg)    SpO2: 98% 99% 96%   I personally performed the services described in this documentation, which was scribed in my presence. The recorded information has been reviewed and is accurate.  Patient presenting to emergency department with shortness of breath and wheezing that started this morning. Patient has history of asthma since he was a child. Reported that he has been using albuterol inhaler, but ran out of his medication.  Alert and oriented. GCS 15. Heart rate and rhythm normal. Pulses palpable and strong, radial 2+ bilaterally. Lungs clear to auscultation to upper and lower lobes bilaterally--negative wheezes or crackles noted. Negative respiratory distress. Patient able to speak in full sentences without difficulty, negative use of excess her muscles. Cap refill less than 3 seconds. Full range of motion to  upper lower tremors bilaterally. Pulse ox with ambulation was 99% on room air. Negative respiratory distress. Patient is able to walk with any signs of chest discomfort, shortness of breath-negative respiratory distress. Patient given albuterol treatment while in ED setting, responded well. Inhaler administered in ED setting. Patient stable, afebrile. Suspicion to be asthma attack that has not been controlled. [facility] Discussed with patient to rest, stay hydrated and taken him as needed.Discussed with patient to closely monitor symptoms and if symptoms are to worsen or change to report back to the ED - strict return instructions given.  Patient agreed to plan of care, understood, all questions answered.     Raymon Mutton, PA-C 02/14/13 1312

## 2013-02-12 NOTE — ED Notes (Signed)
Pt states he normally takes inhaler on daily but ran out a couple of days ago. Does not know what triggered asthma attack. Denies pain or discomfort on assessment. Lung sounds clear.

## 2013-02-12 NOTE — ED Notes (Signed)
Pt states he is having an asthma flare started today.  Pt has run out of meds to take at home.

## 2013-02-12 NOTE — ED Notes (Signed)
Pt ambulatest 99-10% 02 room air; No issues noted

## 2013-02-12 NOTE — ED Notes (Signed)
PA at bedside.

## 2013-02-14 NOTE — ED Provider Notes (Signed)
Medical screening examination/treatment/procedure(s) were performed by non-physician practitioner and as supervising physician I was immediately available for consultation/collaboration.  EKG Interpretation   None        Doug Sou, MD 02/14/13 2049

## 2013-02-16 ENCOUNTER — Emergency Department (HOSPITAL_COMMUNITY): Payer: Self-pay

## 2013-02-16 ENCOUNTER — Emergency Department (HOSPITAL_COMMUNITY)
Admission: EM | Admit: 2013-02-16 | Discharge: 2013-02-16 | Disposition: A | Discharge status: Home / Self Care | Payer: Self-pay | Attending: Emergency Medicine | Admitting: Emergency Medicine

## 2013-02-16 ENCOUNTER — Encounter (HOSPITAL_COMMUNITY): Payer: Self-pay | Admitting: Emergency Medicine

## 2013-02-16 DIAGNOSIS — Z87891 Personal history of nicotine dependence: Secondary | ICD-10-CM | POA: Insufficient documentation

## 2013-02-16 DIAGNOSIS — J45901 Unspecified asthma with (acute) exacerbation: Secondary | ICD-10-CM | POA: Insufficient documentation

## 2013-02-16 DIAGNOSIS — J111 Influenza due to unidentified influenza virus with other respiratory manifestations: Secondary | ICD-10-CM | POA: Insufficient documentation

## 2013-02-16 DIAGNOSIS — IMO0001 Reserved for inherently not codable concepts without codable children: Secondary | ICD-10-CM | POA: Insufficient documentation

## 2013-02-16 DIAGNOSIS — R51 Headache: Secondary | ICD-10-CM | POA: Insufficient documentation

## 2013-02-16 DIAGNOSIS — Z79899 Other long term (current) drug therapy: Secondary | ICD-10-CM | POA: Insufficient documentation

## 2013-02-16 DIAGNOSIS — R6889 Other general symptoms and signs: Secondary | ICD-10-CM

## 2013-02-16 DIAGNOSIS — R000 Tachycardia, unspecified: Secondary | ICD-10-CM | POA: Insufficient documentation

## 2013-02-16 MED ORDER — ACETAMINOPHEN 500 MG PO TABS
1000.0000 mg | ORAL_TABLET | Freq: Once | ORAL | Status: AC
  Administered 2013-02-16: 1000 mg via ORAL
  Filled 2013-02-16: qty 2

## 2013-02-16 MED ORDER — PREDNISONE 20 MG PO TABS: 60.0000 mg | ORAL_TABLET | Freq: Every day | ORAL | Status: AC

## 2013-02-16 MED ORDER — OSELTAMIVIR PHOSPHATE 75 MG PO CAPS: 75.0000 mg | ORAL_CAPSULE | Freq: Two times a day (BID) | ORAL | Status: DC

## 2013-02-16 MED ORDER — ALBUTEROL SULFATE HFA 108 (90 BASE) MCG/ACT IN AERS: 2.0000 | INHALATION_SPRAY | RESPIRATORY_TRACT | Status: DC | PRN

## 2013-02-16 MED ORDER — ALBUTEROL SULFATE HFA 108 (90 BASE) MCG/ACT IN AERS
4.0000 | INHALATION_SPRAY | Freq: Once | RESPIRATORY_TRACT | Status: AC
  Administered 2013-02-16: 4 via RESPIRATORY_TRACT
  Filled 2013-02-16: qty 6.7

## 2013-02-16 MED ORDER — PREDNISONE 20 MG PO TABS
60.0000 mg | ORAL_TABLET | Freq: Once | ORAL | Status: AC
  Administered 2013-02-16: 60 mg via ORAL
  Filled 2013-02-16: qty 3

## 2013-02-16 NOTE — ED Provider Notes (Signed)
CSN: [id number]     Arrival date & time 02/16/13  1201 History   First MD Initiated Contact with Patient 02/16/13 1303     Chief Complaint  Patient presents with  . Nasal Congestion  . Cough  . Fever   (Consider location/radiation/quality/duration/timing/severity/associated sxs/prior Treatment) HPI Comments: 33 year old male with a history of asthma presents with just over 24 hours of flulike symptoms. States yesterday morning she woke up with dry cough, headaches, rhinorrhea and muscle aches. He's been having shortness of breath similar to his asthma exacerbations. He was seen in the ED less than a week ago but states during a move he lost his inhaler that was given to him. He's been having diffuse aches but no focal tenderness.   Past Medical History  Diagnosis Date  . Asthma    History reviewed. No pertinent past surgical history. Family History  Problem Relation Age of Onset  . Asthma Father   . Diabetes Maternal Uncle    History  Substance Use Topics  . Smoking status: Former Smoker    Quit date: 12/09/2012  . Smokeless tobacco: Never Used  . Alcohol Use: 1.8 oz/week    3 Cans of beer per week     Comment: RARELY    Review of Systems  Constitutional: Positive for fever.  HENT: Positive for congestion and rhinorrhea.   Respiratory: Positive for cough and shortness of breath.   Cardiovascular: Negative for chest pain.  Gastrointestinal: Negative for vomiting.  Musculoskeletal: Positive for myalgias.  Neurological: Positive for headaches.  All other systems reviewed and are negative.    Allergies  Other and Shellfish allergy  Home Medications   Current Outpatient Rx  Name  Route  Sig  Dispense  Refill  . albuterol (PROVENTIL) (2.5 MG/3ML) 0.083% nebulizer solution   Nebulization   Take 2.5 mg by nebulization every 6 (six) hours as needed for wheezing or shortness of breath.         Marland Kitchen. amLODipine (NORVASC) 2.5 MG tablet   Oral   Take 1 tablet (2.5 mg total)  by mouth daily.   30 tablet   3    BP 134/77  Pulse 119  Temp(Src) 101.9 F (38.8 C) (Oral)  Resp 20  SpO2 96% Physical Exam  Nursing note and vitals reviewed. Constitutional: He is oriented to person, place, and time. He appears well-developed and well-nourished.  HENT:  Head: Normocephalic and atraumatic.  Right Ear: External ear normal.  Left Ear: External ear normal.  Nose: Nose normal.  Eyes: Right eye exhibits no discharge. Left eye exhibits no discharge.  Neck: Neck supple.  Cardiovascular: Regular rhythm, normal heart sounds and intact distal pulses.  Tachycardia present.   Pulmonary/Chest: Effort normal. He has wheezes (Mild).  Abdominal: Soft. There is no tenderness.  Musculoskeletal: He exhibits no edema.  Neurological: He is alert and oriented to person, place, and time.  Skin: Skin is warm and dry.    ED Course  Procedures (including critical care time) Labs Review Labs Reviewed - No data to display Imaging Review Dg Chest 2 View  02/16/2013   CLINICAL DATA:  Cough and fever  EXAM: CHEST  2 VIEW  COMPARISON:  10/17/2012  FINDINGS: The heart size and mediastinal contours are within normal limits. Both lungs are clear. The visualized skeletal structures are unremarkable.  IMPRESSION: No active cardiopulmonary disease.   Electronically Signed   By: Alcide CleverMark  Lukens M.D.   On: 02/16/2013 13:55    EKG Interpretation   None  MDM   1. Flu-like symptoms   2. Asthma exacerbation    Patient's history and exam are c/w flu. Given the acuity of his symptoms and his history of asthma, he meets criteria for treatment with tamiflu. He was given albuterol with resolution of his symptoms. Tachycardia resolved at rest with fever control. No hypoxia or respiratory distress. His sats stayed above 92% with ambulating. His HR did increase, and I encouraged increased PO fluid intake. Patient understands, will give tamiflu, prednisone burst and refill of albuterol.    Audree Camel, MD 02/16/13 [phone]

## 2013-02-16 NOTE — ED Notes (Signed)
Pt reports congestion, cough, body aches since yesterday. Nauseated yesterday, but no vomiting.

## 2013-03-14 ENCOUNTER — Encounter (HOSPITAL_COMMUNITY): Payer: Self-pay | Admitting: Emergency Medicine

## 2013-03-14 ENCOUNTER — Emergency Department (HOSPITAL_COMMUNITY)
Admission: EM | Admit: 2013-03-14 | Discharge: 2013-03-15 | Disposition: A | Discharge status: Home / Self Care | Payer: Self-pay | Attending: Emergency Medicine | Admitting: Emergency Medicine

## 2013-03-14 DIAGNOSIS — J45901 Unspecified asthma with (acute) exacerbation: Secondary | ICD-10-CM | POA: Insufficient documentation

## 2013-03-14 DIAGNOSIS — Z79899 Other long term (current) drug therapy: Secondary | ICD-10-CM | POA: Insufficient documentation

## 2013-03-14 DIAGNOSIS — J3489 Other specified disorders of nose and nasal sinuses: Secondary | ICD-10-CM | POA: Insufficient documentation

## 2013-03-14 DIAGNOSIS — IMO0002 Reserved for concepts with insufficient information to code with codable children: Secondary | ICD-10-CM | POA: Insufficient documentation

## 2013-03-14 DIAGNOSIS — Z87891 Personal history of nicotine dependence: Secondary | ICD-10-CM | POA: Insufficient documentation

## 2013-03-14 MED ORDER — OXYMETAZOLINE HCL 0.05 % NA SOLN
1.0000 | Freq: Once | NASAL | Status: AC
  Administered 2013-03-14: 1 via NASAL
  Filled 2013-03-14: qty 15

## 2013-03-14 MED ORDER — IPRATROPIUM BROMIDE 0.02 % IN SOLN
0.5000 mg | Freq: Once | RESPIRATORY_TRACT | Status: AC
  Administered 2013-03-14: 0.5 mg via RESPIRATORY_TRACT
  Filled 2013-03-14: qty 2.5

## 2013-03-14 MED ORDER — PREDNISONE 20 MG PO TABS
60.0000 mg | ORAL_TABLET | Freq: Once | ORAL | Status: AC
  Administered 2013-03-14: 60 mg via ORAL
  Filled 2013-03-14: qty 3

## 2013-03-14 MED ORDER — ALBUTEROL SULFATE (2.5 MG/3ML) 0.083% IN NEBU
5.0000 mg | INHALATION_SOLUTION | Freq: Once | RESPIRATORY_TRACT | Status: AC
  Administered 2013-03-14: 5 mg via RESPIRATORY_TRACT
  Filled 2013-03-14: qty 6

## 2013-03-14 NOTE — ED Provider Notes (Signed)
CSN: [id number]     Arrival date & time 03/14/13  2216 History  This chart was scribed for Antony Madura, non-physician practitioner, working with Olivia Mackie, MD, by Ellin Mayhew, ED Scribe. This patient was seen in room WA07/WA07 and the patient's care was started at 11:10 PM.  Chief Complaint  Patient presents with  . Asthma   Patient is a 33 y.o. male presenting with asthma. The history is provided by the patient. No language interpreter was used.  Asthma Associated symptoms include chest pain and shortness of breath. Pertinent negatives include no headaches.   HPI Comments: Bryan Wilkinson is a 33 y.o. male with a history of asthma and allergy to pollen, who presents to the Emergency Department complaining of SOB with onset today. Patient is still currently SOB and was brought to the ED by a family member. He states he has had constant CP from breathing hard located on the L rib and describes it as sharp. He confirms having rhinorrhea and congestion since yesterday. Patient states that while blowing his nose in the other room, he noticed some mild bleeding, likely from dryness. Patient is afebrile, denies vomiting, syncopal episodes or cough. Patient used an albuterol inhaler without relief. Patient was a daily smoker in the past and quit on 12/09/2012.   Past Medical History  Diagnosis Date  . Asthma    History reviewed. No pertinent past surgical history. Family History  Problem Relation Age of Onset  . Asthma Father   . Diabetes Maternal Uncle    History  Substance Use Topics  . Smoking status: Former Smoker    Quit date: 12/09/2012  . Smokeless tobacco: Never Used  . Alcohol Use: 1.8 oz/week    3 Cans of beer per week     Comment: RARELY    Review of Systems  Constitutional: Negative for fever, chills and diaphoresis.  HENT: Positive for congestion and rhinorrhea. Negative for sore throat and voice change.   Respiratory: Positive for chest tightness and shortness of breath.  Negative for cough.   Cardiovascular: Positive for chest pain.  Gastrointestinal: Negative for nausea, vomiting and diarrhea.  Neurological: Negative for dizziness, light-headedness and headaches.  All other systems reviewed and are negative.   Allergies  Other and Shellfish allergy  Home Medications   Current Outpatient Rx  Name  Route  Sig  Dispense  Refill  . albuterol (PROVENTIL) (2.5 MG/3ML) 0.083% nebulizer solution   Nebulization   Take 3 mLs (2.5 mg total) by nebulization every 6 (six) hours as needed for wheezing or shortness of breath.   75 mL   0   . predniSONE (DELTASONE) 20 MG tablet   Oral   Take 2 tablets (40 mg total) by mouth daily.   10 tablet   0    Triage Vitals:BP 138/85  Pulse 102  Temp(Src) 98.3 F (36.8 C) (Oral)  Resp 18  Ht 5\' 11"  (1.803 m)  Wt 265 lb (120.203 kg)  BMI 36.98 kg/m2  SpO2 93%  Physical Exam  Nursing note and vitals reviewed. Constitutional: He is oriented to person, place, and time. He appears well-developed and well-nourished. No distress.  HENT:  Head: Normocephalic and atraumatic.  Mouth/Throat: Oropharynx is clear and moist. No oropharyngeal exudate.  +sinus congestion without rhinorrhea  Eyes: Conjunctivae and EOM are normal. Pupils are equal, round, and reactive to light. No scleral icterus.  Neck: Normal range of motion.  Cardiovascular: Normal rate, regular rhythm and normal heart sounds.  Pulmonary/Chest: Effort normal. No respiratory distress. He has wheezes (diffuse expiratory). He has no rales.  No retractions or accessory muscle use; symmetric chest expansion.  Abdominal: Soft. There is no tenderness. There is no rebound and no guarding.  Musculoskeletal: Normal range of motion.  Neurological: He is alert and oriented to person, place, and time.  GCS 15. Patient moves extremities without ataxia.  Skin: Skin is warm and dry. No rash noted. He is not diaphoretic. No erythema. No pallor.  Psychiatric: He has a  normal mood and affect. His behavior is normal.    ED Course  Procedures (including critical care time)  DIAGNOSTIC STUDIES: Oxygen Saturation is 93% on room air, adequate by my interpretation.    COORDINATION OF CARE: 11:15 PM-Albuterol/ipratropium nebulizer treatment ordered. Treatment plan discussed with patient and patient agrees.  Labs Review Labs Reviewed - No data to display Imaging Review No results found.  EKG Interpretation   None       MDM   1. Asthma exacerbation    Uncomplicated asthma exacerbation. Patient well and nontoxic appearing, hemodynamically stable, and afebrile. He endorses a history of asthma and states that his shortness of breath today is consistent with prior asthma exacerbations. Patient with diffuse expiratory wheezing. No retractions or accessory muscle use. Symmetric chest expansion. Patient treated in ED with oral steroids as well as DuoNeb x2 with improvement in symptoms. He is also given Afrin for nasal congestion with significant relief of this.  Patient ambulates in the ED without hypoxia. He is stable for discharge with albuterol inhaler, albuterol nebulizer solution to use as needed coming and 5 day course of prednisone. Return precautions provided and patient agreeable to plan with no unaddressed concerns.  I personally performed the services described in this documentation, which was scribed in my presence. The recorded information has been reviewed and is accurate.     Antony MaduraKelly Porcia Morganti, PA-C 03/15/13 1939

## 2013-03-14 NOTE — ED Notes (Signed)
Pt c/o diff breathing onset today, pt used rescue inhaler and gained relief for short period of time. Pt would like to get inhaler today while in ED

## 2013-03-15 MED ORDER — ALBUTEROL SULFATE (2.5 MG/3ML) 0.083% IN NEBU
5.0000 mg | INHALATION_SOLUTION | Freq: Once | RESPIRATORY_TRACT | Status: AC
  Administered 2013-03-15: 5 mg via RESPIRATORY_TRACT
  Filled 2013-03-15: qty 6

## 2013-03-15 MED ORDER — ALBUTEROL SULFATE (2.5 MG/3ML) 0.083% IN NEBU: 2.5000 mg | INHALATION_SOLUTION | Freq: Four times a day (QID) | RESPIRATORY_TRACT | Status: DC | PRN

## 2013-03-15 MED ORDER — PREDNISONE 20 MG PO TABS: 40.0000 mg | ORAL_TABLET | Freq: Every day | ORAL | Status: DC

## 2013-03-15 MED ORDER — ALBUTEROL SULFATE HFA 108 (90 BASE) MCG/ACT IN AERS
2.0000 | INHALATION_SPRAY | Freq: Once | RESPIRATORY_TRACT | Status: AC
  Administered 2013-03-15: 2 via RESPIRATORY_TRACT
  Filled 2013-03-15: qty 6.7

## 2013-03-15 MED ORDER — IPRATROPIUM BROMIDE 0.02 % IN SOLN
0.5000 mg | Freq: Once | RESPIRATORY_TRACT | Status: AC
  Administered 2013-03-15: 0.5 mg via RESPIRATORY_TRACT
  Filled 2013-03-15: qty 2.5

## 2013-03-15 NOTE — ED Provider Notes (Signed)
Medical screening examination/treatment/procedure(s) were performed by non-physician practitioner and as supervising physician I was immediately available for consultation/collaboration.    Rainer Mounce M Jeriyah Granlund, MD 03/15/13 2048 

## 2013-03-15 NOTE — Discharge Instructions (Signed)
Asthma, Adult Asthma is a recurring condition in which the airways tighten and narrow. Asthma can make it difficult to breathe. It can cause coughing, wheezing, and shortness of breath. Asthma episodes (also called asthma attacks) range from minor to life-threatening. Asthma cannot be cured, but medicines and lifestyle changes can help control it. CAUSES Asthma is believed to be caused by inherited (genetic) and environmental factors, but its exact cause is unknown. Asthma may be triggered by allergens, lung infections, or irritants in the air. Asthma triggers are different for each person. Common triggers include:   Animal dander.  Dust mites.  Cockroaches.  Pollen from trees or grass.  Mold.  Smoke.  Air pollutants such as dust, household cleaners, hair sprays, aerosol sprays, paint fumes, strong chemicals, or strong odors.  Cold air, weather changes, and winds (which increase molds and pollens in the air).  Strong emotional expressions such as crying or laughing hard.  Stress.  Certain medicines (such as aspirin) or types of drugs (such as beta-blockers).  Sulfites in foods and drinks. Foods and drinks that may contain sulfites include dried fruit, potato chips, and sparkling grape juice.  Infections or inflammatory conditions such as the flu, a cold, or an inflammation of the nasal membranes (rhinitis).  Gastroesophageal reflux disease (GERD).  Exercise or strenuous activity. SYMPTOMS Symptoms may occur immediately after asthma is triggered or many hours later. Symptoms include:  Wheezing.  Excessive nighttime or early morning coughing.  Frequent or severe coughing with a common cold.  Chest tightness.  Shortness of breath. DIAGNOSIS  The diagnosis of asthma is made by a review of your medical history and a physical exam. Tests may also be performed. These may include:  Lung function studies. These tests show how much air you breath in and out.  Allergy  tests.  Imaging tests such as X-rays. TREATMENT  Asthma cannot be cured, but it can usually be controlled. Treatment involves identifying and avoiding your asthma triggers. It also involves medicines. There are 2 classes of medicine used for asthma treatment:   Controller medicines. These prevent asthma symptoms from occurring. They are usually taken every day.  Reliever or rescue medicines. These quickly relieve asthma symptoms. They are used as needed and provide short-term relief. Your health care provider will help you create an asthma action plan. An asthma action plan is a written plan for managing and treating your asthma attacks. It includes a list of your asthma triggers and how they may be avoided. It also includes information on when medicines should be taken and when their dosage should be changed. An action plan may also involve the use of a device called a peak flow meter. A peak flow meter measures how well the lungs are working. It helps you monitor your condition. HOME CARE INSTRUCTIONS   Take medicine as directed by your health care provider. Speak with your health care provider if you have questions about how or when to take the medicines.  Use a peak flow meter as directed by your health care provider. Record and keep track of readings.  Understand and use the action plan to help minimize or stop an asthma attack without needing to seek medical care.  Control your home environment in the following ways to help prevent asthma attacks:  Do not smoke. Avoid being exposed to secondhand smoke.  Change your heating and air conditioning filter regularly.  Limit your use of fireplaces and wood stoves.  Get rid of pests (such as roaches and   mice) and their droppings.  Throw away plants if you see mold on them.  Clean your floors and dust regularly. Use unscented cleaning products.  Try to have someone else vacuum for you regularly. Stay out of rooms while they are being  vacuumed and for a short while afterward. If you vacuum, use a dust mask from a hardware store, a double-layered or microfilter vacuum cleaner bag, or a vacuum cleaner with a HEPA filter.  Replace carpet with wood, tile, or vinyl flooring. Carpet can trap dander and dust.  Use allergy-proof pillows, mattress covers, and box spring covers.  Wash bed sheets and blankets every week in hot water and dry them in a dryer.  Use blankets that are made of polyester or cotton.  Clean bathrooms and kitchens with bleach. If possible, have someone repaint the walls in these rooms with mold-resistant paint. Keep out of the rooms that are being cleaned and painted.  Wash hands frequently. SEEK MEDICAL CARE IF:   You have wheezing, shortness of breath, or a cough even if taking medicine to prevent attacks.  The colored mucus you cough up (sputum) is thicker than usual.  Your sputum changes from clear or white to yellow, green, gray, or bloody.  You have any problems that may be related to the medicines you are taking (such as a rash, itching, swelling, or trouble breathing).  You are using a reliever medicine more than 2 3 times per week.  Your peak flow is still at 50 79% of you personal best after following your action plan for 1 hour. SEEK IMMEDIATE MEDICAL CARE IF:   You seem to be getting worse and are unresponsive to treatment during an asthma attack.  You are short of breath even at rest.  You get short of breath when doing very little physical activity.  You have difficulty eating, drinking, or talking due to asthma symptoms.  You develop chest pain.  You develop a fast heartbeat.  You have a bluish color to your lips or fingernails.  You are lightheaded, dizzy, or faint.  Your peak flow is less than 50% of your personal best.  You have a fever or persistent symptoms for more than 2 3 days.  You have a fever and symptoms suddenly get worse. MAKE SURE YOU:   Understand these  instructions.  Will watch your condition.  Will get help right away if you are not doing well or get worse. Document Released: 02/01/2005 Document Revised: 10/04/2012 Document Reviewed: 08/31/2012 [employer] Patient Information 2014 [employer].  

## 2013-03-20 ENCOUNTER — Encounter (HOSPITAL_COMMUNITY): Payer: Self-pay | Admitting: Emergency Medicine

## 2013-03-20 ENCOUNTER — Emergency Department (HOSPITAL_COMMUNITY): Payer: Self-pay

## 2013-03-20 ENCOUNTER — Emergency Department (HOSPITAL_COMMUNITY)
Admission: EM | Admit: 2013-03-20 | Discharge: 2013-03-21 | Disposition: A | Discharge status: Home / Self Care | Payer: Self-pay | Attending: Emergency Medicine | Admitting: Emergency Medicine

## 2013-03-20 DIAGNOSIS — Z79899 Other long term (current) drug therapy: Secondary | ICD-10-CM | POA: Insufficient documentation

## 2013-03-20 DIAGNOSIS — J45901 Unspecified asthma with (acute) exacerbation: Secondary | ICD-10-CM | POA: Insufficient documentation

## 2013-03-20 DIAGNOSIS — Z87891 Personal history of nicotine dependence: Secondary | ICD-10-CM | POA: Insufficient documentation

## 2013-03-20 MED ORDER — ALBUTEROL SULFATE (2.5 MG/3ML) 0.083% IN NEBU
5.0000 mg | INHALATION_SOLUTION | Freq: Once | RESPIRATORY_TRACT | Status: AC
  Administered 2013-03-20: 5 mg via RESPIRATORY_TRACT
  Filled 2013-03-20: qty 6

## 2013-03-20 MED ORDER — PREDNISONE 20 MG PO TABS
60.0000 mg | ORAL_TABLET | Freq: Once | ORAL | Status: AC
  Administered 2013-03-20: 60 mg via ORAL
  Filled 2013-03-20: qty 3

## 2013-03-20 MED ORDER — IPRATROPIUM BROMIDE 0.02 % IN SOLN
0.5000 mg | Freq: Once | RESPIRATORY_TRACT | Status: AC
  Administered 2013-03-20: 0.5 mg via RESPIRATORY_TRACT
  Filled 2013-03-20: qty 2.5

## 2013-03-20 NOTE — ED Provider Notes (Signed)
CSN: [id number]     Arrival date & time 03/20/13  2148 History   First MD Initiated Contact with Patient 03/20/13 2256     Chief Complaint  Patient presents with  . Shortness of Breath   (Consider location/radiation/quality/duration/timing/severity/associated sxs/prior Treatment) HPI Pt presenting with c/o wheezing and shortness of breath.  Pt has hx of asthma.  Was seen in ED 1/28 for asthma flare.  He states he didn't get the prednisone prescribed from that visit.  For the past several days has had increased shortness of breath and wheezing.  He states he was working in the yard and this seemed to cause wheezing to be worse.  Wheezing and chest tightness worse today.  No fever/chills.  Has had nonproductive cough.  Hx of admissions for asthma as a child.  Denies intubation.  There are no other associated systemic symptoms, there are no other alleviating or modifying factors.   Past Medical History  Diagnosis Date  . Asthma    History reviewed. No pertinent past surgical history. Family History  Problem Relation Age of Onset  . Asthma Father   . Diabetes Maternal Uncle    History  Substance Use Topics  . Smoking status: Former Smoker    Quit date: 12/09/2012  . Smokeless tobacco: Never Used  . Alcohol Use: 1.8 oz/week    3 Cans of beer per week     Comment: RARELY    Review of Systems ROS reviewed and all otherwise negative except for mentioned in HPI  Allergies  Other and Shellfish allergy  Home Medications   Current Outpatient Rx  Name  Route  Sig  Dispense  Refill  . albuterol (PROVENTIL) (2.5 MG/3ML) 0.083% nebulizer solution   Nebulization   Take 2.5 mg by nebulization every 6 (six) hours as needed for wheezing or shortness of breath.         . predniSONE (DELTASONE) 50 MG tablet   Oral   Take 1 tablet (50 mg total) by mouth daily.   4 tablet   0    BP 137/88  Pulse 107  Temp(Src) 97.6 F (36.4 C) (Oral)  Resp 20  SpO2 96% Vitals reviewed Physical  Exam  Physical Examination: General appearance - alert, well appearing, and in no distress Mental status - alert, oriented to person, place, and time Eyes - no scleral icterus, no conjunctival injection Mouth - mucous membranes moist, pharynx normal without lesions Chest -  Mild expiratory wheezing after breathing treatment, no rales or rhonchi, symmetric air entry, normal respiratory effort, speaking in full sentences Heart - normal rate, regular rhythm, normal S1, S2, no murmurs, rubs, clicks or gallops Abdomen - soft, nontender, nondistended, no masses or organomegaly Extremities - peripheral pulses normal, no pedal edema, no clubbing or cyanosis Skin - normal coloration and turgor, no rashes  ED Course  Procedures (including critical care time) Labs Review Labs Reviewed - No data to display Imaging Review Dg Chest 2 View (if Patient Has Fever And/or Copd)  03/20/2013   CLINICAL DATA:  Shortness of breath and asthma.  EXAM: CHEST  2 VIEW  COMPARISON:  02/16/2013  FINDINGS: Normal heart size and mediastinal contours. No acute infiltrate or edema. No effusion or pneumothorax. No acute osseous findings.  IMPRESSION: No active cardiopulmonary disease.   Electronically Signed   By: Tiburcio PeaJonathan  Watts M.D.   On: 03/20/2013 22:50    EKG Interpretation    Date/Time:  Tuesday March 20 2013 22:03:19 EST Ventricular Rate:  116 PR  Interval:  138 QRS Duration: 86 QT Interval:  322 QTC Calculation: 447 R Axis:   80 Text Interpretation:  Sinus tachycardia Otherwise normal ECG Since previous tracing rate faster Confirmed by Karma Ganja  MD, Laporchia Nakajima [phone]) on 03/20/2013 11:13:03 PM            MDM   1. Asthma exacerbation    Pt presenting with c/o wheezing.  Has hx of asthma, recently treated for exacerbation but did not get steroids filled.  Pt feels improved after 2 nebs in the ED. Midly tachycardic likely from albuterol.  Advised of importance of prednisone course for treatment of asthma  exacerbation.  Discharged with strict return precautions.  Pt agreeable with plan.    Ethelda Chick, MD 03/21/13 [phone]

## 2013-03-20 NOTE — ED Notes (Signed)
Pt reports to the ED for eval of asthma exacerbation. Pt audibly wheezing and unable to speak in full sentences. Pt tachypnic and tachycardic in the 100s-120s. Breathing tx started. Pt denies allergy to peanuts. Pt reports he did some yard work several days ago and has had intermittent SOB ever since then but it grew significantly worse tonight. Pt attempted a nebulizer tx at home and reports it did not help. Pt A&Ox4 and skin warm and dry.

## 2013-03-20 NOTE — ED Notes (Signed)
Pt states that he has an albuterol inhaler but needs a new prescription for it. States he also would like a take home one. States that he is feeling better after the breathing treatment he received here.

## 2013-03-21 MED ORDER — ALBUTEROL SULFATE HFA 108 (90 BASE) MCG/ACT IN AERS
2.0000 | INHALATION_SPRAY | RESPIRATORY_TRACT | Status: DC | PRN
  Administered 2013-03-21: 2 via RESPIRATORY_TRACT
  Filled 2013-03-21: qty 6.7

## 2013-03-21 MED ORDER — PREDNISONE 50 MG PO TABS: 50.0000 mg | ORAL_TABLET | Freq: Every day | ORAL | Status: DC

## 2013-03-21 NOTE — Discharge Instructions (Signed)
Return to the ED with any concerns including difficulty breathing despite using albuterol every 4 hours, not drinking fluids, decreased urine output, vomiting and not able to keep down liquids or medications, decreased level of alertness/lethargy, or any other alarming symptoms °

## 2013-03-25 ENCOUNTER — Emergency Department (HOSPITAL_COMMUNITY)
Admission: EM | Admit: 2013-03-25 | Discharge: 2013-03-25 | Disposition: A | Discharge status: Home / Self Care | Payer: Self-pay | Attending: Emergency Medicine | Admitting: Emergency Medicine

## 2013-03-25 ENCOUNTER — Encounter (HOSPITAL_COMMUNITY): Payer: Self-pay | Admitting: Emergency Medicine

## 2013-03-25 DIAGNOSIS — Z87891 Personal history of nicotine dependence: Secondary | ICD-10-CM | POA: Insufficient documentation

## 2013-03-25 DIAGNOSIS — J45901 Unspecified asthma with (acute) exacerbation: Secondary | ICD-10-CM | POA: Insufficient documentation

## 2013-03-25 MED ORDER — PREDNISONE 20 MG PO TABS
60.0000 mg | ORAL_TABLET | Freq: Once | ORAL | Status: AC
  Administered 2013-03-25: 60 mg via ORAL
  Filled 2013-03-25: qty 3

## 2013-03-25 MED ORDER — PREDNISONE 10 MG PO TABS: ORAL_TABLET | ORAL | Status: DC

## 2013-03-25 MED ORDER — ALBUTEROL SULFATE (2.5 MG/3ML) 0.083% IN NEBU
5.0000 mg | INHALATION_SOLUTION | Freq: Once | RESPIRATORY_TRACT | Status: AC
  Administered 2013-03-25: 5 mg via RESPIRATORY_TRACT
  Filled 2013-03-25: qty 6

## 2013-03-25 MED ORDER — IPRATROPIUM BROMIDE 0.02 % IN SOLN
0.5000 mg | Freq: Once | RESPIRATORY_TRACT | Status: AC
  Administered 2013-03-25: 0.5 mg via RESPIRATORY_TRACT
  Filled 2013-03-25: qty 2.5

## 2013-03-25 MED ORDER — ALBUTEROL SULFATE HFA 108 (90 BASE) MCG/ACT IN AERS: 1.0000 | INHALATION_SPRAY | Freq: Four times a day (QID) | RESPIRATORY_TRACT | Status: DC | PRN

## 2013-03-25 MED ORDER — ALBUTEROL SULFATE HFA 108 (90 BASE) MCG/ACT IN AERS
2.0000 | INHALATION_SPRAY | Freq: Once | RESPIRATORY_TRACT | Status: AC
  Administered 2013-03-25: 2 via RESPIRATORY_TRACT
  Filled 2013-03-25: qty 6.7

## 2013-03-25 NOTE — Discharge Instructions (Signed)
Asthma, Adult °Asthma is a recurring condition in which the airways tighten and narrow. Asthma can make it difficult to breathe. It can cause coughing, wheezing, and shortness of breath. Asthma episodes (also called asthma attacks) range from minor to life-threatening. Asthma cannot be cured, but medicines and lifestyle changes can help control it. °CAUSES °Asthma is believed to be caused by inherited (genetic) and environmental factors, but its exact cause is unknown. Asthma may be triggered by allergens, lung infections, or irritants in the air. Asthma triggers are different for each person. Common triggers include:  °· Animal dander. °· Dust mites. °· Cockroaches. °· Pollen from trees or grass. °· Mold. °· Smoke. °· Air pollutants such as dust, household cleaners, hair sprays, aerosol sprays, paint fumes, strong chemicals, or strong odors. °· Cold air, weather changes, and winds (which increase molds and pollens in the air). °· Strong emotional expressions such as crying or laughing hard. °· Stress. °· Certain medicines (such as aspirin) or types of drugs (such as beta-blockers). °· Sulfites in foods and drinks. Foods and drinks that may contain sulfites include dried fruit, potato chips, and sparkling grape juice. °· Infections or inflammatory conditions such as the flu, a cold, or an inflammation of the nasal membranes (rhinitis). °· Gastroesophageal reflux disease (GERD). °· Exercise or strenuous activity. °SYMPTOMS °Symptoms may occur immediately after asthma is triggered or many hours later. Symptoms include: °· Wheezing. °· Excessive nighttime or early morning coughing. °· Frequent or severe coughing with a common cold. °· Chest tightness. °· Shortness of breath. °DIAGNOSIS  °The diagnosis of asthma is made by a review of your medical history and a physical exam. Tests may also be performed. These may include: °· Lung function studies. These tests show how much air you breath in and out. °· Allergy  tests. °· Imaging tests such as X-rays. °TREATMENT  °Asthma cannot be cured, but it can usually be controlled. Treatment involves identifying and avoiding your asthma triggers. It also involves medicines. There are 2 classes of medicine used for asthma treatment:  °· Controller medicines. These prevent asthma symptoms from occurring. They are usually taken every day. °· Reliever or rescue medicines. These quickly relieve asthma symptoms. They are used as needed and provide short-term relief. °Your health care provider will help you create an asthma action plan. An asthma action plan is a written plan for managing and treating your asthma attacks. It includes a list of your asthma triggers and how they may be avoided. It also includes information on when medicines should be taken and when their dosage should be changed. An action plan may also involve the use of a device called a peak flow meter. A peak flow meter measures how well the lungs are working. It helps you monitor your condition. °HOME CARE INSTRUCTIONS  °· Take medicine as directed by your health care provider. Speak with your health care provider if you have questions about how or when to take the medicines. °· Use a peak flow meter as directed by your health care provider. Record and keep track of readings. °· Understand and use the action plan to help minimize or stop an asthma attack without needing to seek medical care. °· Control your home environment in the following ways to help prevent asthma attacks: °· Do not smoke. Avoid being exposed to secondhand smoke. °· Change your heating and air conditioning filter regularly. °· Limit your use of fireplaces and wood stoves. °· Get rid of pests (such as roaches and   mice) and their droppings.  Throw away plants if you see mold on them.  Clean your floors and dust regularly. Use unscented cleaning products.  Try to have someone else vacuum for you regularly. Stay out of rooms while they are being  vacuumed and for a short while afterward. If you vacuum, use a dust mask from a hardware store, a double-layered or microfilter vacuum cleaner bag, or a vacuum cleaner with a HEPA filter.  Replace carpet with wood, tile, or vinyl flooring. Carpet can trap dander and dust.  Use allergy-proof pillows, mattress covers, and box spring covers.  Wash bed sheets and blankets every week in hot water and dry them in a dryer.  Use blankets that are made of polyester or cotton.  Clean bathrooms and kitchens with bleach. If possible, have someone repaint the walls in these rooms with mold-resistant paint. Keep out of the rooms that are being cleaned and painted.  Wash hands frequently. SEEK MEDICAL CARE IF:   You have wheezing, shortness of breath, or a cough even if taking medicine to prevent attacks.  The colored mucus you cough up (sputum) is thicker than usual.  Your sputum changes from clear or white to yellow, green, gray, or bloody.  You have any problems that may be related to the medicines you are taking (such as a rash, itching, swelling, or trouble breathing).  You are using a reliever medicine more than 2 3 times per week.  Your peak flow is still at 50 79% of you personal best after following your action plan for 1 hour. SEEK IMMEDIATE MEDICAL CARE IF:   You seem to be getting worse and are unresponsive to treatment during an asthma attack.  You are short of breath even at rest.  You get short of breath when doing very little physical activity.  You have difficulty eating, drinking, or talking due to asthma symptoms.  You develop chest pain.  You develop a fast heartbeat.  You have a bluish color to your lips or fingernails.  You are lightheaded, dizzy, or faint.  Your peak flow is less than 50% of your personal best.  You have a fever or persistent symptoms for more than 2 3 days.  You have a fever and symptoms suddenly get worse. MAKE SURE YOU:   Understand these  instructions.  Will watch your condition.  Will get help right away if you are not doing well or get worse. Document Released: 02/01/2005 Document Revised: 10/04/2012 Document Reviewed: 08/31/2012 [facility] Patient Information 2014 [employer] [place].  Follow-up with regular MD for ongoing management of Asthma Take prednisone as prescribed Return for increased shortness of breath, wheezing or fever

## 2013-03-25 NOTE — ED Notes (Signed)
The pt is c/o diff breqathing for 2 days.  He ran out of his inhaler

## 2013-03-25 NOTE — ED Provider Notes (Signed)
CSN: [id number]     Arrival date & time 03/25/13  9629 History   First MD Initiated Contact with Patient 03/25/13 [phone]     Chief Complaint  Patient presents with  . Shortness of Breath   (Consider location/radiation/quality/duration/timing/severity/associated sxs/prior Treatment) Patient is a 33 y.o. male presenting with wheezing. The history is provided by the patient. No language interpreter was used.  Wheezing Severity:  Moderate Duration:  2 days Timing:  Intermittent Progression:  Worsening Associated symptoms: chest tightness   Associated symptoms: no chest pain, no cough, no fever, no rhinorrhea and no stridor   Risk factors: no prior intubations    Pt is a 33 year old male who presents with wheezing this morning. He reports that he ran out of his albuterol inhaler and that his asthma has been getting worse for the last 1-2 days. He denies fever, chills, recent illness or exposure. He reports that he has been wheezing more the last day when he ran out of his albuterol. No known sick exposure or travel. Seen here on 1/28 and 2/3 for the same, it appears that he didn't get his prednisone rx filled.  Past Medical History  Diagnosis Date  . Asthma    History reviewed. No pertinent past surgical history. Family History  Problem Relation Age of Onset  . Asthma Father   . Diabetes Maternal Uncle    History  Substance Use Topics  . Smoking status: Former Smoker    Quit date: 12/09/2012  . Smokeless tobacco: Never Used  . Alcohol Use: 1.8 oz/week    3 Cans of beer per week     Comment: RARELY    Review of Systems  Constitutional: Negative for fever.  HENT: Negative for rhinorrhea.   Respiratory: Positive for chest tightness and wheezing. Negative for cough and stridor.   Cardiovascular: Negative for chest pain.  Neurological: Negative for speech difficulty and weakness.  All other systems reviewed and are negative.    Allergies  Other and Shellfish allergy  Home  Medications   Current Outpatient Rx  Name  Route  Sig  Dispense  Refill  . albuterol (PROVENTIL) (2.5 MG/3ML) 0.083% nebulizer solution   Nebulization   Take 2.5 mg by nebulization every 6 (six) hours as needed for wheezing or shortness of breath.         . predniSONE (DELTASONE) 50 MG tablet   Oral   Take 1 tablet (50 mg total) by mouth daily.   4 tablet   0    BP 168/93  Pulse 108  Resp 20  Ht 5\' 11"  (1.803 m)  Wt 263 lb (119.296 kg)  BMI 36.70 kg/m2  SpO2 94% Physical Exam  Nursing note and vitals reviewed. Constitutional: He is oriented to person, place, and time. He appears well-developed and well-nourished. No distress.  HENT:  Head: Normocephalic and atraumatic.  Eyes: Conjunctivae and EOM are normal.  Neck: Normal range of motion. Neck supple.  Cardiovascular: Normal rate, regular rhythm, normal heart sounds and intact distal pulses.   Pulmonary/Chest: Effort normal. He has wheezes in the right upper field, the right middle field, the left upper field and the left middle field.  Abdominal: Soft. Bowel sounds are normal. He exhibits no distension. There is no tenderness.  Musculoskeletal: Normal range of motion.  Neurological: He is alert and oriented to person, place, and time.  Skin: Skin is warm and dry. No rash noted.  Psychiatric: He has a normal mood and affect. His behavior is  normal. Judgment and thought content normal.    ED Course  Procedures (including critical care time) Labs Review Labs Reviewed - No data to display Imaging Review No results found.  EKG Interpretation   None      7:49 AM Pt verbalizes feeling much better after breathing treatments. SPO2 98%. No retractions, wheezing, accessory muscle use or difficulty breathing.   MDM   1. Asthma exacerbation    Wheezing cleared after Duoneb x2. Marked improvement and reports that he is feeling much better. Multiple visits for same in the last 2-3 weeks, ran out of inhaler this morning  and reports that he has not been gettting his prednisone rx filled. Discussed plan with pt and told him where to go for rx for 4 dollar list. Pt agrees to plan.      Irish EldersKelly Daryus Sowash, NP 03/25/13 [phone]

## 2013-03-25 NOTE — ED Notes (Signed)
Pt states that he was having trouble breathing, but following the breathing treatment he has no complaints.  Pt states he feels 100% and is having no difficultly breathing in the slightest.

## 2013-03-26 NOTE — ED Provider Notes (Signed)
Medical screening examination/treatment/procedure(s) were conducted as a shared visit with non-physician practitioner(s) and myself.  I personally evaluated the patient during the encounter.  EKG Interpretation   None         Brandt LoosenJulie Manly, MD 03/26/13 1925

## 2013-04-30 ENCOUNTER — Ambulatory Visit: Payer: Self-pay | Attending: Internal Medicine | Admitting: Internal Medicine

## 2013-04-30 VITALS — BP 133/85 | HR 100 | Temp 98.9°F | Resp 18 | Ht 71.0 in | Wt 280.6 lb

## 2013-04-30 DIAGNOSIS — J45909 Unspecified asthma, uncomplicated: Secondary | ICD-10-CM

## 2013-04-30 MED ORDER — MOMETASONE FURO-FORMOTEROL FUM 200-5 MCG/ACT IN AERO: 2.0000 | INHALATION_SPRAY | Freq: Two times a day (BID) | RESPIRATORY_TRACT | Status: DC

## 2013-04-30 MED ORDER — ALBUTEROL SULFATE HFA 108 (90 BASE) MCG/ACT IN AERS: 1.0000 | INHALATION_SPRAY | Freq: Four times a day (QID) | RESPIRATORY_TRACT | Status: DC | PRN

## 2013-04-30 NOTE — Progress Notes (Signed)
Follow up on asthma States he does not feel 100% yet Is using inhaler more often Refill on inhaler needed

## 2013-04-30 NOTE — Progress Notes (Signed)
Patient ID: Bryan Wilkinson, male   DOB: 1980/05/31, 33 y.o.   MRN: [id number]   CC:  HPI: Face year-old male with a history of asthma comes in for a followup after ED visit in January and February for asthma exacerbation Patient has a nebulizer as well as an inhaler. He completed a course of prednisone taper last month. Today he does complain of wheezing and still using his nebulizer 3 or 4 times a day But he appears comfortable does not appear to be significantly short of breath.    Allergies  Allergen Reactions  . Other Other (See Comments)    Certain fruit irritate throat slightly. Banana, Cantaloupe, Watermelon.  . Shellfish Allergy Other (See Comments)    Slight irritation of throat   Past Medical History  Diagnosis Date  . Asthma    No current outpatient prescriptions on file prior to visit.   No current facility-administered medications on file prior to visit.   Family History  Problem Relation Age of Onset  . Asthma Father   . Diabetes Maternal Uncle    History   Social History  . Marital Status: Single    Spouse Name: N/A    Number of Children: N/A  . Years of Education: N/A   Occupational History  . Not on file.   Social History Main Topics  . Smoking status: Former Smoker    Quit date: 12/09/2012  . Smokeless tobacco: Never Used  . Alcohol Use: 1.8 oz/week    3 Cans of beer per week     Comment: RARELY  . Drug Use: No  . Sexual Activity: Yes   Other Topics Concern  . Not on file   Social History Narrative  . No narrative on file    Review of Systems  Constitutional: Negative for fever, chills, diaphoresis, activity change, appetite change and fatigue.  HENT: Negative for ear pain, nosebleeds, congestion, facial swelling, rhinorrhea, neck pain, neck stiffness and ear discharge.   Eyes: Negative for pain, discharge, redness, itching and visual disturbance.  Respiratory: Negative for cough, choking, chest tightness, shortness of breath, wheezing  and stridor.   Cardiovascular: Negative for chest pain, palpitations and leg swelling.  Gastrointestinal: Negative for abdominal distention.  Genitourinary: Negative for dysuria, urgency, frequency, hematuria, flank pain, decreased urine volume, difficulty urinating and dyspareunia.  Musculoskeletal: Negative for back pain, joint swelling, arthralgias and gait problem.  Neurological: Negative for dizziness, tremors, seizures, syncope, facial asymmetry, speech difficulty, weakness, light-headedness, numbness and headaches.  Hematological: Negative for adenopathy. Does not bruise/bleed easily.  Psychiatric/Behavioral: Negative for hallucinations, behavioral problems, confusion, dysphoric mood, decreased concentration and agitation.    Objective:   Filed Vitals:   04/30/13 1122  BP: 133/85  Pulse: 100  Temp: 98.9 F (37.2 C)  Resp: 18    Physical Exam  Constitutional: Appears well-developed and well-nourished. No distress.  HENT: Normocephalic. External right and left ear normal. Oropharynx is clear and moist.  Eyes: Conjunctivae and EOM are normal. PERRLA, no scleral icterus.  Neck: Normal ROM. Neck supple. No JVD. No tracheal deviation. No thyromegaly.  CVS: RRR, S1/S2 +, no murmurs, no gallops, no carotid bruit.  Pulmonary: Effort and breath sounds normal, no stridor, rhonchi, wheezes, rales.  Abdominal: Soft. BS +,  no distension, tenderness, rebound or guarding.  Musculoskeletal: Normal range of motion. No edema and no tenderness.  Lymphadenopathy: No lymphadenopathy noted, cervical, inguinal. Neuro: Alert. Normal reflexes, muscle tone coordination. No cranial nerve deficit. Skin: Skin is warm and dry.  No rash noted. Not diaphoretic. No erythema. No pallor.  Psychiatric: Normal mood and affect. Behavior, judgment, thought content normal.   Lab Results  Component Value Date   WBC 9.0 10/17/2012   HGB 15.1 10/17/2012   HCT 42.1 10/17/2012   MCV 90.3 10/17/2012   PLT 228 10/17/2012    Lab Results  Component Value Date   CREATININE 0.94 10/17/2012   BUN 10 10/17/2012   NA 140 10/17/2012   K 3.8 10/17/2012   CL 103 10/17/2012   CO2 25 10/17/2012    No results found for this basename: HGBA1C   Lipid Panel  No results found for this basename: chol, trig, hdl, cholhdl, vldl, ldlcalc       Assessment and plan:   Patient Active Problem List   Diagnosis Date Noted  . HTN (hypertension) 01/09/2013  . Extrinsic asthma, unspecified 01/09/2013   Chronic moderate asthma Patient will be started on dulera and he is agreeable to this Did not feel that the patient is having an exacerbation today Therefore no prednisone is being prescribed Most recent chest x-ray 03/20/13 was negative Continue albuterol as needed The patient will followup in 2 months        The patient was given clear instructions to go to ER or return to medical center if symptoms don't improve, worsen or new problems develop. The patient verbalized understanding. The patient was told to call to get any lab results if not heard anything in the next week.

## 2013-06-04 ENCOUNTER — Emergency Department (HOSPITAL_COMMUNITY)
Admission: EM | Admit: 2013-06-04 | Discharge: 2013-06-04 | Disposition: A | Discharge status: Home / Self Care | Payer: Self-pay | Attending: Emergency Medicine | Admitting: Emergency Medicine

## 2013-06-04 ENCOUNTER — Encounter (HOSPITAL_COMMUNITY): Payer: Self-pay | Admitting: Emergency Medicine

## 2013-06-04 DIAGNOSIS — J45909 Unspecified asthma, uncomplicated: Secondary | ICD-10-CM

## 2013-06-04 DIAGNOSIS — Z87891 Personal history of nicotine dependence: Secondary | ICD-10-CM | POA: Insufficient documentation

## 2013-06-04 DIAGNOSIS — J45901 Unspecified asthma with (acute) exacerbation: Secondary | ICD-10-CM | POA: Insufficient documentation

## 2013-06-04 DIAGNOSIS — Z79899 Other long term (current) drug therapy: Secondary | ICD-10-CM | POA: Insufficient documentation

## 2013-06-04 MED ORDER — ALBUTEROL SULFATE (2.5 MG/3ML) 0.083% IN NEBU: 2.5000 mg | INHALATION_SOLUTION | Freq: Four times a day (QID) | RESPIRATORY_TRACT | Status: AC | PRN

## 2013-06-04 MED ORDER — IPRATROPIUM-ALBUTEROL 0.5-2.5 (3) MG/3ML IN SOLN
3.0000 mL | Freq: Once | RESPIRATORY_TRACT | Status: AC
  Administered 2013-06-04: 3 mL via RESPIRATORY_TRACT
  Filled 2013-06-04: qty 3

## 2013-06-04 MED ORDER — ALBUTEROL SULFATE HFA 108 (90 BASE) MCG/ACT IN AERS
2.0000 | INHALATION_SPRAY | RESPIRATORY_TRACT | Status: DC | PRN
  Administered 2013-06-04: 2 via RESPIRATORY_TRACT
  Filled 2013-06-04: qty 6.7

## 2013-06-04 NOTE — ED Notes (Signed)
Per pt sts his asthma is acting up. sts wheezing and SOB since yesterday. sts he hasn't had any asthma medications. No distress noted.

## 2013-06-04 NOTE — ED Provider Notes (Signed)
CSN: [id number]     Arrival date & time 06/04/13  1519 History  This chart was scribed for non-physician practitioner Junius FinnerErin O'Malley, PA-C working with Dagmar HaitWilliam Blair Walden, MD by Joaquin MusicKristina Sanchez-Matthews, ED Scribe. This patient was seen in room TR05C/TR05C and the patient's care was started at 4:44 PM .   Chief Complaint  Patient presents with  . Asthma   The history is provided by the patient. No language interpreter was used.   HPI Comments: Bryan Wilkinson is a 33 y.o. male who presents to the Emergency Department complaining of asthma exacerbation, wheezing and SOB that began 2 days ago. Pt states he generally uses albuterol but states he does not have any albuterol or nebulizer solution at home at this time. He denies taking any OTC medications. He denies having a PCP. Pt denies sore throat, cough, fever, emesis, and diarrhea.  Past Medical History  Diagnosis Date  . Asthma    History reviewed. No pertinent past surgical history. Family History  Problem Relation Age of Onset  . Asthma Father   . Diabetes Maternal Uncle    History  Substance Use Topics  . Smoking status: Former Smoker    Quit date: 12/09/2012  . Smokeless tobacco: Never Used  . Alcohol Use: 1.8 oz/week    3 Cans of beer per week     Comment: RARELY    Review of Systems  Constitutional: Negative for fever.  HENT: Negative for sore throat.   Respiratory: Positive for cough and shortness of breath.   Gastrointestinal: Negative for nausea, vomiting and diarrhea.   Allergies  Other and Shellfish allergy  Home Medications   Prior to Admission medications   Medication Sig Start Date End Date Taking? Authorizing Provider  albuterol (PROVENTIL HFA;VENTOLIN HFA) 108 (90 BASE) MCG/ACT inhaler Inhale 2 puffs into the lungs every 6 (six) hours as needed for wheezing or shortness of breath. 04/30/13  Yes Richarda OverlieNayana Abrol, MD  Aspirin-Acetaminophen-Caffeine (GOODY HEADACHE PO) Take 1 packet by mouth 2 (two) times daily as  needed (for headache).   Yes Historical Provider, MD  loratadine (CLARITIN) 10 MG tablet Take 10 mg by mouth daily as needed for allergies.   Yes Historical Provider, MD   BP 138/87  Pulse 86  Temp(Src) 98.1 F (36.7 C) (Oral)  Resp 18  SpO2 96%  Physical Exam  Nursing note and vitals reviewed. Constitutional: He appears well-developed and well-nourished. No distress.  HENT:  Head: Normocephalic and atraumatic.  Neck: Neck supple.  Cardiovascular: Normal rate, regular rhythm and normal heart sounds.   Pulmonary/Chest: Effort normal. He has wheezes.  Mild R lower lobe wheezing. No respiratory distress. Able to speak in full sentences.   Abdominal: Soft. There is no tenderness.  Musculoskeletal: Normal range of motion.  Neurological: He is alert.  Skin: Skin is warm and dry. He is not diaphoretic.  Psychiatric: He has a normal mood and affect. His behavior is normal.   ED Course  Procedures  DIAGNOSTIC STUDIES: Oxygen Saturation is 96% on RA, normal by my interpretation.    COORDINATION OF CARE: 4:47 PM-Discussed treatment plan which includes discharge pt with albuterol inhaler and nebulizer solution. Pt states his breathing has improved since breathing tx in the ED. Pt agreed to plan.   Labs Review Labs Reviewed - No data to display  Imaging Review No results found.   EKG Interpretation None      MDM   Final diagnoses:  Asthma    Pt tx with duoneb  tx in ED, pt reports feeling better. States it's easier to breath.  Lungs: CTAB. No respiratory distress. Advised to f/u with PCP. Return precautions provided. Pt verbalized understanding and agreement with tx plan.   I personally performed the services described in this documentation, which was scribed in my presence. The recorded information has been reviewed and is accurate.    Junius Finnerrin O'Malley, PA-C 06/04/13 1701

## 2013-06-04 NOTE — Discharge Instructions (Signed)
Asthma, Adult  Asthma is a condition of the lungs in which the airways tighten and narrow. Asthma can make it hard to breathe. Asthma cannot be cured, but medicine and lifestyle changes can help control it. Asthma may be started (triggered) by:  · Animal skin flakes (dander).  · Dust.  · Cockroaches.  · Pollen.  · Mold.  · Smoke.  · Cleaning products.  · Hair sprays or aerosol sprays.  · Paint fumes or strong smells.  · Cold air, weather changes, and winds.  · Crying or laughing hard.  · Stress.  · Certain medicines or drugs.  · Foods, such as dried fruit, potato chips, and sparkling grape juice.  · Infections or conditions (colds, flu).  · Exercise.  · Certain medical conditions or diseases.  · Exercise or tiring activities.  HOME CARE   · Take medicine as told by your doctor.  · Use a peak flow meter as told by your doctor. A peak flow meter is a tool that measures how well the lungs are working.  · Record and keep track of the peak flow meter's readings.  · Understand and use the asthma action plan. An asthma action plan is a written plan for taking care of your asthma and treating your attacks.  · To help prevent asthma attacks:  · Do not smoke. Stay away from secondhand smoke.  · Change your heating and air conditioning filter often.  · Limit your use of fireplaces and wood stoves.  · Get rid of pests (such as roaches and mice) and their droppings.  · Throw away plants if you see mold on them.  · Clean your floors. Dust regularly. Use cleaning products that do not smell.  · Have someone vacuum when you are not home. Use a vacuum cleaner with a HEPA filter if possible.  · Replace carpet with wood, tile, or vinyl flooring. Carpet can trap animal skin flakes and dust.  · Use allergy-proof pillows, mattress covers, and box spring covers.  · Wash bed sheets and blankets every week in hot water and dry them in a dryer.  · Use blankets that are made of polyester or cotton.  · Clean bathrooms and kitchens with bleach.  If possible, have someone repaint the walls in these rooms with mold-resistant paint. Keep out of the rooms that are being cleaned and painted.  · Wash hands often.  GET HELP IF:  · You have make a whistling sound when breaking (wheeze), have shortness of breath, or have a cough even if taking medicine to prevent attacks.  · The colored mucus you cough up (sputum) is thicker than usual.  · The colored mucus you cough up changes from clear or white to yellow, green, gray, or bloody.  · You have problems from the medicine you are taking such as:  · A rash.  · Itching.  · Swelling.  · Trouble breathing.  · You need reliever medicines more than 2 3 times a week.  · Your peak flow measurement is still at 50 79% of your personal best after following the action plan for 1 hour.  GET HELP RIGHT AWAY IF:   · You seem to be worse and are not responding to medicine during an asthma attack.  · You are short of breath even at rest.  · You get short of breath when doing very little activity.  · You have trouble eating, drinking, or talking.  · You have chest pain.  ·   You have a fast heartbeat.  · Your lips or fingernails start to turn blue.  · You are lightheaded, dizzy, or faint.  · Your peak flow is less than 50% of your personal best.  · You have a fever or lasting symptoms for more than 2 3 days.  · You have a fever and your symptoms suddenly get worse.  MAKE SURE YOU:   · Understand these instructions.  · Will watch your condition.  · Will get help right away if you are not doing well or get worse.  Document Released: 07/21/2007 Document Revised: 11/22/2012 Document Reviewed: 08/31/2012  [employer]® Patient Information ©2014 [employer].

## 2013-06-07 NOTE — ED Provider Notes (Signed)
Medical screening examination/treatment/procedure(s) were performed by non-physician practitioner and as supervising physician I was immediately available for consultation/collaboration.   EKG Interpretation None        William Wright Gravely, MD 06/07/13 1839 

## 2013-07-01 ENCOUNTER — Emergency Department (HOSPITAL_COMMUNITY)
Admission: EM | Admit: 2013-07-01 | Discharge: 2013-07-01 | Disposition: A | Discharge status: Home / Self Care | Payer: Self-pay | Attending: Emergency Medicine | Admitting: Emergency Medicine

## 2013-07-01 ENCOUNTER — Encounter (HOSPITAL_COMMUNITY): Payer: Self-pay | Admitting: Emergency Medicine

## 2013-07-01 DIAGNOSIS — J45901 Unspecified asthma with (acute) exacerbation: Secondary | ICD-10-CM | POA: Insufficient documentation

## 2013-07-01 DIAGNOSIS — Z79899 Other long term (current) drug therapy: Secondary | ICD-10-CM | POA: Insufficient documentation

## 2013-07-01 DIAGNOSIS — Z87891 Personal history of nicotine dependence: Secondary | ICD-10-CM | POA: Insufficient documentation

## 2013-07-01 MED ORDER — PREDNISONE 20 MG PO TABS: 40.0000 mg | ORAL_TABLET | Freq: Every day | ORAL | Status: DC

## 2013-07-01 MED ORDER — ALBUTEROL SULFATE HFA 108 (90 BASE) MCG/ACT IN AERS
2.0000 | INHALATION_SPRAY | Freq: Once | RESPIRATORY_TRACT | Status: AC
  Administered 2013-07-01: 2 via RESPIRATORY_TRACT
  Filled 2013-07-01: qty 6.7

## 2013-07-01 MED ORDER — IPRATROPIUM BROMIDE 0.02 % IN SOLN
0.5000 mg | Freq: Once | RESPIRATORY_TRACT | Status: AC
  Administered 2013-07-01: 0.5 mg via RESPIRATORY_TRACT
  Filled 2013-07-01: qty 2.5

## 2013-07-01 MED ORDER — ALBUTEROL SULFATE HFA 108 (90 BASE) MCG/ACT IN AERS: 2.0000 | INHALATION_SPRAY | RESPIRATORY_TRACT | Status: AC | PRN

## 2013-07-01 MED ORDER — PREDNISONE 20 MG PO TABS
60.0000 mg | ORAL_TABLET | Freq: Once | ORAL | Status: AC
  Administered 2013-07-01: 60 mg via ORAL
  Filled 2013-07-01: qty 3

## 2013-07-01 MED ORDER — ALBUTEROL SULFATE (2.5 MG/3ML) 0.083% IN NEBU
5.0000 mg | INHALATION_SOLUTION | Freq: Once | RESPIRATORY_TRACT | Status: AC
  Administered 2013-07-01: 5 mg via RESPIRATORY_TRACT
  Filled 2013-07-01: qty 6

## 2013-07-01 NOTE — ED Provider Notes (Signed)
Medical screening examination/treatment/procedure(s) were performed by non-physician practitioner and as supervising physician I was immediately available for consultation/collaboration.   EKG Interpretation None        Emir Nack N Ranger Petrich, DO 07/01/13 1539 

## 2013-07-01 NOTE — ED Notes (Signed)
Breathing treatment ended. Pt reports feeling "much better" and that he is breathing without difficulty now.

## 2013-07-01 NOTE — ED Notes (Signed)
Pt ambulatory with no SOB or respiratory distress noted. Inhaler given to patient and AVS explained. No other questions from pt. Ambulatory with steady gait.

## 2013-07-01 NOTE — ED Provider Notes (Signed)
CSN: [id number]     Arrival date & time 07/01/13  1152 History   First MD Initiated Contact with Patient 07/01/13 1207     Chief Complaint  Patient presents with  . Asthma     (Consider location/radiation/quality/duration/timing/severity/associated sxs/prior Treatment) HPI Comments: Patient is a 33 year-old male with history of asthma who presents today with partially worsening shortness of breath. He states this began last night especially worsened throughout today. He has associated wheezing and mild cough. He denies any chest pain or pain with inspiration. He has a runny nose. He states that the change in the weather generally triggers his allergies and asthma. He does not believe he has ever been hospitalized or intubated for his asthma. He states that he has been hospitalized, it was a very long time ago when he was a child.  Patient is a [age] y.o. male presenting with asthma. The history is provided by the patient. No language interpreter was used.  Asthma Associated symptoms include coughing. Pertinent negatives include no abdominal pain, chest pain, chills, fever, nausea or vomiting.    Past Medical History  Diagnosis Date  . Asthma    History reviewed. No pertinent past surgical history. Family History  Problem Relation Age of Onset  . Asthma Father   . Diabetes Maternal Uncle    History  Substance Use Topics  . Smoking status: Former Smoker    Quit date: 12/09/2012  . Smokeless tobacco: Never Used  . Alcohol Use: 1.8 oz/week    3 Cans of beer per week     Comment: RARELY    Review of Systems  Constitutional: Negative for fever and chills.  Respiratory: Positive for cough, chest tightness, shortness of breath and wheezing.   Cardiovascular: Negative for chest pain and leg swelling.  Gastrointestinal: Negative for nausea, vomiting and abdominal pain.  All other systems reviewed and are negative.     Allergies  Other and Shellfish allergy  Home Medications    Prior to Admission medications   Medication Sig Start Date End Date Taking? Authorizing Provider  albuterol (PROVENTIL HFA;VENTOLIN HFA) 108 (90 BASE) MCG/ACT inhaler Inhale 2 puffs into the lungs every 6 (six) hours as needed for wheezing or shortness of breath. 04/30/13  Yes Richarda OverlieNayana Abrol, MD  albuterol (PROVENTIL) (2.5 MG/3ML) 0.083% nebulizer solution Take 3 mLs (2.5 mg total) by nebulization every 6 (six) hours as needed for wheezing or shortness of breath. 06/04/13  Yes Junius FinnerErin O'Malley, PA-C  Aspirin-Acetaminophen-Caffeine (GOODY HEADACHE PO) Take 1 packet by mouth 2 (two) times daily as needed (for headache).   Yes Historical Provider, MD  loratadine (CLARITIN) 10 MG tablet Take 10 mg by mouth daily as needed for allergies.   Yes Historical Provider, MD  albuterol (PROVENTIL HFA;VENTOLIN HFA) 108 (90 BASE) MCG/ACT inhaler Inhale 2 puffs into the lungs every 2 (two) hours as needed for wheezing or shortness of breath (cough). 07/01/13   Mora BellmanHannah S Kahla Risdon, PA-C  predniSONE (DELTASONE) 20 MG tablet Take 2 tablets (40 mg total) by mouth daily. 07/01/13   Ramon DredgeHannah S Nekia Maxham, PA-C   BP 135/80  Pulse 83  Temp(Src) 98 F (36.7 C) (Oral)  Resp 18  SpO2 100% Physical Exam  Nursing note and vitals reviewed. Constitutional: He is oriented to person, place, and time. He appears well-developed and well-nourished. No distress.  HENT:  Head: Normocephalic and atraumatic.  Right Ear: External ear normal.  Left Ear: External ear normal.  Nose: Nose normal.  Eyes: Conjunctivae are normal.  Neck: Normal range of motion. No tracheal deviation present.  Cardiovascular: Normal rate, regular rhythm and normal heart sounds.   Pulmonary/Chest: Effort normal. No stridor. He has wheezes (mild, expiratory).  Speaking easily in full sentences  Abdominal: Soft. He exhibits no distension. There is no tenderness.  Musculoskeletal: Normal range of motion.  Neurological: He is alert and oriented to person, place, and  time.  Skin: Skin is warm and dry. He is not diaphoretic.  Psychiatric: He has a normal mood and affect. His behavior is normal.    ED Course  Procedures (including critical care time) Labs Review Labs Reviewed - No data to display  Imaging Review No results found.   EKG Interpretation None      MDM   Final diagnoses:  Asthma exacerbation    Patient ambulated in ED with O2 saturations maintained >90, no current signs of respiratory distress. Lung exam improved after nebulizer treatment. Prednisone given in the ED and pt will bd dc with 5 day burst. Pt states they are breathing at baseline. Pt has been instructed to continue using prescribed medications and to speak with PCP about today's exacerbation.    Mora BellmanHannah S Sincere Berlanga, PA-C 07/01/13 1520

## 2013-07-01 NOTE — Discharge Instructions (Signed)
Asthma, Adult Asthma is a recurring condition in which the airways tighten and narrow. Asthma can make it difficult to breathe. It can cause coughing, wheezing, and shortness of breath. Asthma episodes (also called asthma attacks) range from minor to life-threatening. Asthma cannot be cured, but medicines and lifestyle changes can help control it. CAUSES Asthma is believed to be caused by inherited (genetic) and environmental factors, but its exact cause is unknown. Asthma may be triggered by allergens, lung infections, or irritants in the air. Asthma triggers are different for each person. Common triggers include:   Animal dander.  Dust mites.  Cockroaches.  Pollen from trees or grass.  Mold.  Smoke.  Air pollutants such as dust, household cleaners, hair sprays, aerosol sprays, paint fumes, strong chemicals, or strong odors.  Cold air, weather changes, and winds (which increase molds and pollens in the air).  Strong emotional expressions such as crying or laughing hard.  Stress.  Certain medicines (such as aspirin) or types of drugs (such as beta-blockers).  Sulfites in foods and drinks. Foods and drinks that may contain sulfites include dried fruit, potato chips, and sparkling grape juice.  Infections or inflammatory conditions such as the flu, a cold, or an inflammation of the nasal membranes (rhinitis).  Gastroesophageal reflux disease (GERD).  Exercise or strenuous activity. SYMPTOMS Symptoms may occur immediately after asthma is triggered or many hours later. Symptoms include:  Wheezing.  Excessive nighttime or early morning coughing.  Frequent or severe coughing with a common cold.  Chest tightness.  Shortness of breath. DIAGNOSIS  The diagnosis of asthma is made by a review of your medical history and a physical exam. Tests may also be performed. These may include:  Lung function studies. These tests show how much air you breath in and out.  Allergy  tests.  Imaging tests such as X-rays. TREATMENT  Asthma cannot be cured, but it can usually be controlled. Treatment involves identifying and avoiding your asthma triggers. It also involves medicines. There are 2 classes of medicine used for asthma treatment:   Controller medicines. These prevent asthma symptoms from occurring. They are usually taken every day.  Reliever or rescue medicines. These quickly relieve asthma symptoms. They are used as needed and provide short-term relief. Your health care provider will help you create an asthma action plan. An asthma action plan is a written plan for managing and treating your asthma attacks. It includes a list of your asthma triggers and how they may be avoided. It also includes information on when medicines should be taken and when their dosage should be changed. An action plan may also involve the use of a device called a peak flow meter. A peak flow meter measures how well the lungs are working. It helps you monitor your condition. HOME CARE INSTRUCTIONS   Take medicine as directed by your health care provider. Speak with your health care provider if you have questions about how or when to take the medicines.  Use a peak flow meter as directed by your health care provider. Record and keep track of readings.  Understand and use the action plan to help minimize or stop an asthma attack without needing to seek medical care.  Control your home environment in the following ways to help prevent asthma attacks:  Do not smoke. Avoid being exposed to secondhand smoke.  Change your heating and air conditioning filter regularly.  Limit your use of fireplaces and wood stoves.  Get rid of pests (such as roaches and   mice) and their droppings.  Throw away plants if you see mold on them.  Clean your floors and dust regularly. Use unscented cleaning products.  Try to have someone else vacuum for you regularly. Stay out of rooms while they are being  vacuumed and for a short while afterward. If you vacuum, use a dust mask from a hardware store, a double-layered or microfilter vacuum cleaner bag, or a vacuum cleaner with a HEPA filter.  Replace carpet with wood, tile, or vinyl flooring. Carpet can trap dander and dust.  Use allergy-proof pillows, mattress covers, and box spring covers.  Wash bed sheets and blankets every week in hot water and dry them in a dryer.  Use blankets that are made of polyester or cotton.  Clean bathrooms and kitchens with bleach. If possible, have someone repaint the walls in these rooms with mold-resistant paint. Keep out of the rooms that are being cleaned and painted.  Wash hands frequently. SEEK MEDICAL CARE IF:   You have wheezing, shortness of breath, or a cough even if taking medicine to prevent attacks.  The colored mucus you cough up (sputum) is thicker than usual.  Your sputum changes from clear or white to yellow, green, gray, or bloody.  You have any problems that may be related to the medicines you are taking (such as a rash, itching, swelling, or trouble breathing).  You are using a reliever medicine more than 2 3 times per week.  Your peak flow is still at 50 79% of you personal best after following your action plan for 1 hour. SEEK IMMEDIATE MEDICAL CARE IF:   You seem to be getting worse and are unresponsive to treatment during an asthma attack.  You are short of breath even at rest.  You get short of breath when doing very little physical activity.  You have difficulty eating, drinking, or talking due to asthma symptoms.  You develop chest pain.  You develop a fast heartbeat.  You have a bluish color to your lips or fingernails.  You are lightheaded, dizzy, or faint.  Your peak flow is less than 50% of your personal best.  You have a fever or persistent symptoms for more than 2 3 days.  You have a fever and symptoms suddenly get worse. MAKE SURE YOU:   Understand these  instructions.  Will watch your condition.  Will get help right away if you are not doing well or get worse. Document Released: 02/01/2005 Document Revised: 10/04/2012 Document Reviewed: 08/31/2012 [employer] Patient Information 2014 [employer].  

## 2013-07-01 NOTE — ED Notes (Signed)
Pt has inhaler at home and took last dose 2 days ago. Pt states breathing has been difficult for last two days.  Short of breath with exertion.

## 2013-07-01 NOTE — ED Notes (Signed)
Prior ED note by M. Costella HatcherHamby, RN.  Error in charting.

## 2013-07-01 NOTE — ED Notes (Signed)
Initial contact-A&O x4. SOB since yesterday. Uses albuterol and nebulizers at home. Took last inhaler treatment today. Wishes to be sent home with inhaler. In NAD. Ambulatory with no other complaints.

## 2013-07-02 ENCOUNTER — Ambulatory Visit: Payer: Self-pay | Admitting: Internal Medicine

## 2013-07-09 ENCOUNTER — Ambulatory Visit: Payer: Self-pay | Admitting: Internal Medicine

## 2013-08-25 ENCOUNTER — Emergency Department (HOSPITAL_COMMUNITY)
Admission: EM | Admit: 2013-08-25 | Discharge: 2013-08-25 | Disposition: A | Discharge status: Home / Self Care | Payer: Self-pay | Attending: Emergency Medicine | Admitting: Emergency Medicine

## 2013-08-25 ENCOUNTER — Encounter (HOSPITAL_COMMUNITY): Payer: Self-pay | Admitting: Emergency Medicine

## 2013-08-25 DIAGNOSIS — IMO0002 Reserved for concepts with insufficient information to code with codable children: Secondary | ICD-10-CM | POA: Insufficient documentation

## 2013-08-25 DIAGNOSIS — Z760 Encounter for issue of repeat prescription: Secondary | ICD-10-CM | POA: Insufficient documentation

## 2013-08-25 DIAGNOSIS — Z87891 Personal history of nicotine dependence: Secondary | ICD-10-CM | POA: Insufficient documentation

## 2013-08-25 DIAGNOSIS — J45909 Unspecified asthma, uncomplicated: Secondary | ICD-10-CM

## 2013-08-25 DIAGNOSIS — J45901 Unspecified asthma with (acute) exacerbation: Secondary | ICD-10-CM | POA: Insufficient documentation

## 2013-08-25 MED ORDER — ALBUTEROL SULFATE HFA 108 (90 BASE) MCG/ACT IN AERS
2.0000 | INHALATION_SPRAY | RESPIRATORY_TRACT | Status: DC | PRN
  Administered 2013-08-25: 2 via RESPIRATORY_TRACT
  Filled 2013-08-25: qty 6.7

## 2013-08-25 NOTE — ED Notes (Signed)
Pt c/o SOB d/t asthma.  States that it got worse last night.  Did not use his inhaler because he does not have one.  No audible wheezing and no distress noted.

## 2013-08-25 NOTE — ED Provider Notes (Signed)
CSN: [id number]     Arrival date & time 08/25/13  1214 History   This chart was scribed for non-physician practitioner working with No att. providers found, by Jarvis Morganaylor Ferguson, ED Scribe. This patient was seen in room WTR5/WTR5 and the patient's care was started at 1:35 PM.    Chief Complaint  Patient presents with  . Asthma  . Cough      The history is provided by the patient. No language interpreter was used.   HPI Comments: Bryan Wilkinson is a 33 y.o. male with a h/o of asthma who presents to the Emergency Department complaining of mild, constant, shortness of breath that began last night. Patient states he is having an associated mild cough and chest tightness. Patient states that he was moving furniture when his shortness of breath began. Patient states that he does not have an inhaler. Patient states that he sees a [facility]CP [facility] but he does not see her very often and has been trying to get an appt for the end of the month. Patient admits that he is an occasional smoker. Patient denies any rhinorrhea, sore throat, congestion, sinus pressure, ear pain, fever, nausea or emesis.    Past Medical History  Diagnosis Date  . Asthma    No past surgical history on file. Family History  Problem Relation Age of Onset  . Asthma Father   . Diabetes Maternal Uncle    History  Substance Use Topics  . Smoking status: Former Smoker    Quit date: 12/09/2012  . Smokeless tobacco: Never Used  . Alcohol Use: 1.8 oz/week    3 Cans of beer per week     Comment: RARELY    Review of Systems  Constitutional: Negative for fever.  HENT: Negative for congestion, ear pain, rhinorrhea, sinus pressure, sneezing and sore throat.   Respiratory: Positive for cough (mild), chest tightness and shortness of breath. Negative for wheezing.   Gastrointestinal: Negative for nausea and vomiting.  All other systems reviewed and are negative.     Allergies  Other and Shellfish  allergy  Home Medications   Prior to Admission medications   Medication Sig Start Date End Date Taking? Authorizing Provider  albuterol (PROVENTIL HFA;VENTOLIN HFA) 108 (90 BASE) MCG/ACT inhaler Inhale 2 puffs into the lungs every 6 (six) hours as needed for wheezing or shortness of breath. 04/30/13   Richarda OverlieNayana Abrol, MD  albuterol (PROVENTIL HFA;VENTOLIN HFA) 108 (90 BASE) MCG/ACT inhaler Inhale 2 puffs into the lungs every 2 (two) hours as needed for wheezing or shortness of breath (cough). 07/01/13   Mora BellmanHannah S Merrell, PA-C  albuterol (PROVENTIL) (2.5 MG/3ML) 0.083% nebulizer solution Take 3 mLs (2.5 mg total) by nebulization every 6 (six) hours as needed for wheezing or shortness of breath. 06/04/13   Junius FinnerErin O'Malley, PA-C  Aspirin-Acetaminophen-Caffeine (GOODY HEADACHE PO) Take 1 packet by mouth 2 (two) times daily as needed (for headache).    Historical Provider, MD  loratadine (CLARITIN) 10 MG tablet Take 10 mg by mouth daily as needed for allergies.    Historical Provider, MD  predniSONE (DELTASONE) 20 MG tablet Take 2 tablets (40 mg total) by mouth daily. 07/01/13   Mora BellmanHannah S Merrell, PA-C   Triage Vitals: BP 132/78  Pulse 80  Temp(Src) 98.1 F (36.7 C) (Oral)  Resp 16  SpO2 97%  Physical Exam  Nursing note and vitals reviewed. Constitutional: He is oriented to person, place, and time. He appears well-developed and well-nourished.  HENT:  Head: Normocephalic and atraumatic.  Eyes: EOM are normal.  Neck: Normal range of motion.  Cardiovascular: Normal rate.   Pulmonary/Chest: Effort normal and breath sounds normal. No respiratory distress. He has no wheezes. He has no rales.  Musculoskeletal: Normal range of motion.  Neurological: He is alert and oriented to person, place, and time.  Skin: Skin is warm and dry.  Psychiatric: He has a normal mood and affect. His behavior is normal.    ED Course  Procedures (including critical care time)  DIAGNOSTIC STUDIES: Oxygen Saturation is 97%  on RA, normal by my interpretation.    COORDINATION OF CARE: 1:08 PM- Will discharge pt with Albuterol inhaler. Pt advised of plan for treatment and pt agrees.    Labs Review Labs Reviewed - No data to display  Imaging Review No results found.   EKG Interpretation None      MDM   Final diagnoses:  Asthma in adult, unspecified asthma severity, uncomplicated  Medication refill    Pt is a 33yo male with hx of asthma presenting to ED c/o asthma exacerbation yesterday after moving furniture.  Today, pt appears well, non-toxic. No respiratory distress. Lungs: CTAB.  Will provide pt with albuterol inhaler. Advised to f/u as scheduled with PCP at the end of the month. Advised to schedule appointment next week if symptoms not improving.  Pt has been to ED multiple times for asthma related complaints. Discussed with pt importance of not smoking as well as importance for close follow up with primary care provider and possibly a pulmonologist for better control of asthma symptoms. Return precautions provided. Pt verbalized understanding and agreement with tx plan.   I personally performed the services described in this documentation, which was scribed in my presence. The recorded information has been reviewed and is accurate.    Junius Finner, PA-C 08/25/13 1343

## 2013-08-25 NOTE — Discharge Instructions (Signed)
Asthma, Adult  Asthma is a condition of the lungs in which the airways tighten and narrow. Asthma can make it hard to breathe. Asthma cannot be cured, but medicine and lifestyle changes can help control it. Asthma may be started (triggered) by:  · Animal skin flakes (dander).  · Dust.  · Cockroaches.  · Pollen.  · Mold.  · Smoke.  · Cleaning products.  · Hair sprays or aerosol sprays.  · Paint fumes or strong smells.  · Cold air, weather changes, and winds.  · Crying or laughing hard.  · Stress.  · Certain medicines or drugs.  · Foods, such as dried fruit, potato chips, and sparkling grape juice.  · Infections or conditions (colds, flu).  · Exercise.  · Certain medical conditions or diseases.  · Exercise or tiring activities.  HOME CARE   · Take medicine as told by your doctor.  · Use a peak flow meter as told by your doctor. A peak flow meter is a tool that measures how well the lungs are working.  · Record and keep track of the peak flow meter's readings.  · Understand and use the asthma action plan. An asthma action plan is a written plan for taking care of your asthma and treating your attacks.  · To help prevent asthma attacks:  ¨ Do not smoke. Stay away from secondhand smoke.  ¨ Change your heating and air conditioning filter often.  ¨ Limit your use of fireplaces and wood stoves.  ¨ Get rid of pests (such as roaches and mice) and their droppings.  ¨ Throw away plants if you see mold on them.  ¨ Clean your floors. Dust regularly. Use cleaning products that do not smell.  ¨ Have someone vacuum when you are not home. Use a vacuum cleaner with a HEPA filter if possible.  ¨ Replace carpet with wood, tile, or vinyl flooring. Carpet can trap animal skin flakes and dust.  ¨ Use allergy-proof pillows, mattress covers, and box spring covers.  ¨ Wash bed sheets and blankets every week in hot water and dry them in a dryer.  ¨ Use blankets that are made of polyester or cotton.  ¨ Clean bathrooms and kitchens with bleach.  If possible, have someone repaint the walls in these rooms with mold-resistant paint. Keep out of the rooms that are being cleaned and painted.  ¨ Wash hands often.  GET HELP IF:  · You have make a whistling sound when breaking (wheeze), have shortness of breath, or have a cough even if taking medicine to prevent attacks.  · The colored mucus you cough up (sputum) is thicker than usual.  · The colored mucus you cough up changes from clear or white to yellow, green, gray, or bloody.  · You have problems from the medicine you are taking such as:  ¨ A rash.  ¨ Itching.  ¨ Swelling.  ¨ Trouble breathing.  · You need reliever medicines more than 2-3 times a week.  · Your peak flow measurement is still at 50-79% of your personal best after following the action plan for 1 hour.  GET HELP RIGHT AWAY IF:   · You seem to be worse and are not responding to medicine during an asthma attack.  · You are short of breath even at rest.  · You get short of breath when doing very little activity.  · You have trouble eating, drinking, or talking.  · You have chest pain.  · You have   a fast heartbeat.  · Your lips or fingernails start to turn blue.  · You are lightheaded, dizzy, or faint.  · Your peak flow is less than 50% of your personal best.  · You have a fever or lasting symptoms for more than 2-3 days.  · You have a fever and your symptoms suddenly get worse.  MAKE SURE YOU:   · Understand these instructions.  · Will watch your condition.  · Will get help right away if you are not doing well or get worse.  Document Released: 07/21/2007 Document Revised: 11/22/2012 Document Reviewed: 08/31/2012  [employer]® Patient Information ©2015 [employer]. This information is not intended to replace advice given to you by your health care provider. Make sure you discuss any questions you have with your health care provider.

## 2013-08-26 NOTE — ED Provider Notes (Signed)
Medical screening examination/treatment/procedure(s) were performed by non-physician practitioner and as supervising physician I was immediately available for consultation/collaboration.   EKG Interpretation None        Neena Beecham, MD 08/26/13 0654 

## 2013-09-13 ENCOUNTER — Ambulatory Visit: Payer: Self-pay | Admitting: Internal Medicine

## 2013-11-03 ENCOUNTER — Emergency Department (HOSPITAL_COMMUNITY): Payer: Self-pay

## 2013-11-03 ENCOUNTER — Emergency Department (HOSPITAL_COMMUNITY)
Admission: EM | Admit: 2013-11-03 | Discharge: 2013-11-03 | Disposition: A | Discharge status: Home / Self Care | Payer: Self-pay | Attending: Emergency Medicine | Admitting: Emergency Medicine

## 2013-11-03 ENCOUNTER — Encounter (HOSPITAL_COMMUNITY): Payer: Self-pay | Admitting: Emergency Medicine

## 2013-11-03 DIAGNOSIS — J45901 Unspecified asthma with (acute) exacerbation: Secondary | ICD-10-CM | POA: Insufficient documentation

## 2013-11-03 DIAGNOSIS — T148XXA Other injury of unspecified body region, initial encounter: Secondary | ICD-10-CM

## 2013-11-03 DIAGNOSIS — R000 Tachycardia, unspecified: Secondary | ICD-10-CM | POA: Insufficient documentation

## 2013-11-03 DIAGNOSIS — IMO0002 Reserved for concepts with insufficient information to code with codable children: Secondary | ICD-10-CM | POA: Insufficient documentation

## 2013-11-03 DIAGNOSIS — Z87891 Personal history of nicotine dependence: Secondary | ICD-10-CM | POA: Insufficient documentation

## 2013-11-03 DIAGNOSIS — Z79899 Other long term (current) drug therapy: Secondary | ICD-10-CM | POA: Insufficient documentation

## 2013-11-03 DIAGNOSIS — R0602 Shortness of breath: Secondary | ICD-10-CM | POA: Insufficient documentation

## 2013-11-03 DIAGNOSIS — S298XXA Other specified injuries of thorax, initial encounter: Secondary | ICD-10-CM | POA: Insufficient documentation

## 2013-11-03 MED ORDER — ALBUTEROL SULFATE HFA 108 (90 BASE) MCG/ACT IN AERS: 1.0000 | INHALATION_SPRAY | RESPIRATORY_TRACT | Status: AC | PRN

## 2013-11-03 MED ORDER — HYDROCODONE-ACETAMINOPHEN 5-325 MG PO TABS: 1.0000 | ORAL_TABLET | ORAL | Status: AC | PRN

## 2013-11-03 MED ORDER — PREDNISONE 20 MG PO TABS: 40.0000 mg | ORAL_TABLET | Freq: Every day | ORAL | Status: AC
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
## 2013-11-03 NOTE — ED Provider Notes (Signed)
Medical screening examination/treatment/procedure(s) were conducted as a shared visit with non-physician practitioner(s) and myself.  I personally evaluated the patient during the encounter.   EKG Interpretation   Date/Time:  Saturday November 03 2013 05:43:20 EDT Ventricular Rate:  111 PR Interval:  159 QRS Duration: 85 QT Interval:  319 QTC Calculation: 433 R Axis:   59 Text Interpretation:  Sinus tachycardia Non-specific ST-t changes No  significant change since last tracing Confirmed by Bebe Shaggy  MD, Ilario Dhaliwal  [phone]) on 11/03/2013 5:50:48 AM        Joya Gaskins, MD 11/03/13 2332

## 2013-11-03 NOTE — Discharge Instructions (Signed)
Call for a follow up appointment with a Family or Primary Care Provider.  Return if Symptoms worsen.   Take medication as prescribed.  Do not operate heavy machinery or drink alcohol while taking Norco. Ice your back 3-4 times a day.  Emergency Department Resource Guide 1) Find a Doctor and Pay Out of Pocket Although you won't have to find out who is covered by your insurance plan, it is a good idea to ask around and get recommendations. You will then need to call the office and see if the doctor you have chosen will accept you as a new patient and what types of options they offer for patients who are self-pay. Some doctors offer discounts or will set up payment plans for their patients who do not have insurance, but you will need to ask so you aren't surprised when you get to your appointment.  2) Contact Your Local Health Department Not all health departments have doctors that can see patients for sick visits, but many do, so it is worth a call to see if yours does. If you don't know where your local health department is, you can check in your phone book. The CDC also has a tool to help you locate your state's health department, and many state websites also have listings of all of their local health departments.  3) Find a Walk-in Clinic If your illness is not likely to be very severe or complicated, you may want to try a walk in clinic. These are popping up all over the country in pharmacies, drugstores, and shopping centers. They're usually staffed by nurse practitioners or physician assistants that have been trained to treat common illnesses and complaints. They're usually fairly quick and inexpensive. However, if you have serious medical issues or chronic medical problems, these are probably not your best option.  No Primary Care Doctor: - Call Health Connect at  [phone] - they can help you locate a primary care doctor that  accepts your insurance, provides certain services, etc. - Physician  Referral Service- [phone]  Chronic Pain Problems: Organization         Address  Phone   Notes  [facility]  [phone] Patients need to be referred by their primary care doctor.   Medication Assistance: Organization         Address  Phone   Notes  [facility] Medication [facility] [address] [place] [place], [place] [address] [phone] --Must be a resident of [facility] -- Must have NO insurance coverage whatsoever (no Medicaid/ Medicare, etc.) -- The pt. MUST have a primary care doctor that directs their care regularly and follows them in the community   [facility]  [phone]   [employer]  [phone]    Agencies that provide inexpensive medical care: Organization         Address  Phone   Notes  [facility]  [phone]   [facility]    [phone]   [facility] [address] [place], [place] [address] [phone]   [facility] [address] [place], [place] [phone]   Planned Parenthood    [phone]   [facility]    [phone]   [facility]  [address], [place] Phone:  [phone], Fax:  [phone] Hours of Operation:  9 am - 6 pm, M-F.  Also accepts  Medicaid/Medicare and self-pay.  [facility] for [place] [address], [address], [place] Phone: [phone], Fax: [phone]. Hours of Operation:  8:30 am - 5:30 pm, M-F.  Also accepts Medicaid and self-pay.  [facility] [address] [place] Phone: [phone]   [place], [place], [place] [phone], Ext. [phone] Mondays & Thursdays: 7-9 AM.  First 15 patients are seen on a first come, first serve basis.    [place] Providers:  Organization         Address  Phone   Notes  [facility] [address], [place] [phone] Also accepts self-pay patients.  [facility] [id number] [place], [place]  [phone]   [place], [address], [place] [phone]   [facility] [address], [place] [phone]   Lucianne Lei [address], [place]   [phone] Only accepts [place] Access [place] patients after they have their name applied to their card.   Self-Pay (no insurance) in [facility]:  Organization         Address  Phone   Notes  Sickle Cell Patients, [facility] [place] [phone]   [facility] Urgent Care [place] [phone]   [facility] [place]  [place], [place], [place] [phone]   [facility]/Dr. Osei-Bonsu  [address], [place] or [address], [place] [phone] Phone number for both [place] and [place] locations is the same.  Urgent Medical and [facility] [address] [place] [phone]   [facility] [address] [place] or [address] [phone]   [facility] [address] [place] [phone], phone; [phone], fax Sees patients 1st and 3rd Saturday of every month.  Must not qualify for public or private insurance (i.e. Medicaid, Medicare, [place] Health Choice, Veterans' Benefits)  Household income should be no more than 200% of the poverty level The clinic cannot treat you if you are pregnant or think you are pregnant  Sexually transmitted diseases are not treated at the clinic.    Dental Care: Organization         Address  Phone  Notes  [facility] Department of [facility] [phone] Accepts children up to age 64 who are enrolled  in [place] or [place]; pregnant women with a Medicaid card; and children who have applied for Medicaid or [place] Health Choice, but were declined, whose parents can pay a reduced fee at time of service.  [facility] Department of [facility]  [address], [place] [phone] Accepts children up to age 8 who are enrolled in [place] or [place]; pregnant women with a Medicaid card; and children who have applied for Medicaid or [place] Health Choice, but were declined, whose parents can pay a reduced fee at time of service.  [facility]  [place] [phone] Patients are seen by appointment only. Walk-ins are not accepted. [place] will see patients 59 years of age and older. Monday - Tuesday (8am-5pm) Most  Wednesdays (8:30-5pm) $30 per visit, cash only  [facility] Adult Dental Access PROGRAM  [address], [facility] [phone] Patients are seen by appointment only. Walk-ins are not accepted. [place] will see patients 42 years of age and older. One Wednesday Evening (Monthly: Volunteer Based).  $30 per visit, cash only  [place]  [phone] for adults; Children under age 75, call Graduate Pediatric Dentistry at [phone]. Children aged 77-14, please call [phone] to request a pediatric application.  Dental services are provided in all areas of dental care including fillings, crowns and bridges, complete and partial dentures, implants, gum treatment, root canals, and extractions. Preventive care is also provided. Treatment is provided to both adults and children. Patients are selected via a lottery and there is often a waiting list.   [facility] [address], [place]  [phone] [email or web address]   [facility] [address] [place], [place] [phone], Ext. [phone] Second and Fourth Thursday of each month, opens at  6:30 AM; Clinic ends at 9 AM.  Patients are seen on a first-come first-served basis, and a limited number are seen during each clinic.   [facility]  [address] Hillard Danker [place], [place] [phone]   Eligibility Requirements You must have lived in [place], [place], or [place] counties for at least the last three months.   You cannot be eligible for state or federal sponsored [employer], including [employer], [place], or [employer].   You generally cannot be eligible for healthcare insurance through your employer.    How to apply: Eligibility screenings are held every Tuesday and Wednesday afternoon from 1:00 pm until 4:00 pm. You do not need an appointment for the interview!  [facility] [address] [place], [place]   [place]  [place] Department  [place]  [phone]    Behavioral Health Resources in the Community: Intensive Outpatient Programs Organization         Address  Phone  Notes  [place]. [address] [place], [place] [phone]   [facility] [address], [place], [place]   ADS: [facility] [address] [place], [place]   [place] [address]  [place], [place] or [phone]   Substance Abuse Resources Organization         Address  Phone  Notes  [facility] Drug Services  8670985017   [place]  [phone]   [facility]   Chinita Pester  [phone]   Residential & Outpatient Substance Abuse Program  7026683602   Psychological Services Organization         Address  Phone  Notes  [facility] [place]  [place]  [phone]   [place] [address] [place] or [phone]    Mobile Crisis Teams Organization         Address  Phone  Notes  [facility], Mobile Crisis Care Unit  [phone]   [facility]  [address] [place], [place]   [place] [address] [place] [phone]    Self-Help/Support Groups Organization         Address  Phone  Notes  [facility] [place] - variety of support groups  [place] Call for more information  Narcotics Anonymous (NA), [facility] [address], [employer] [place]  2 meetings at this location   Special educational needs teacher         Address  Phone  Notes  [facility] [place],    [place]  [phone]   [facility]  [address] [place] [address], [place], [place]   [place], [place] [phone] Admissions: 8am-3pm M-F  Incentives Substance [place] [address],    [place], [place] [phone]   [facility] [address] [place], [place], [place]   [facility] [address],  [place], [place]   [facility] - Intensive Outpatient [address]een [address], [place], [place]   [facility] ([place].,  [place], [place] [phone] or [phone]   [facility] ([facility]) [address], [place], [place] Accepts Medicaid  [facility] [place] [address],  [place] [phone] Substance Abuse/Addiction Treatment   [facility] Organization         Address  Phone  Notes  [facility]  [phone]   Domenic Schwab, PhD [address] Arlis [place], [place]   [phone] or [phone]   [place], [place] [phone]   [facility]  [address] [place], [place] [phone] Insurance/Medicaid/sponsorship through [facility] and Families [address]                                    [place], [place] [phone] [place] [address], [place] [phone]    Dr. Adele Schilder  [phone]   [facility] Dept. 1) [address] [place] 2) [place] 3)  [place] [address], [place] [phone]  [phone]   [place] [phone] or [phone] (After Hours)

## 2013-11-03 NOTE — ED Provider Notes (Signed)
Patient seen/examined in the Emergency Department in conjunction with Midlevel Provider [place] Patient reports SOB/chest wall pain with asthma exacerbation Exam : awake/alert, no distress, wheeze resolved, tenderness to palpation of upper chest Plan: CXR and then d/c home    Joya Gaskins, MD 11/03/13 [phone]

## 2013-11-03 NOTE — ED Notes (Signed)
Mellody Drown, PA-C at bedside assessing patient

## 2015-11-20 IMAGING — CR DG CHEST 2V
2 series · 2 of 2 positions shown · non-contrast
Comparison: 03/20/2013.

CLINICAL DATA: Chest and back pain and shortness of breath.

EXAM:
CHEST  2 VIEW

[w chest pa]
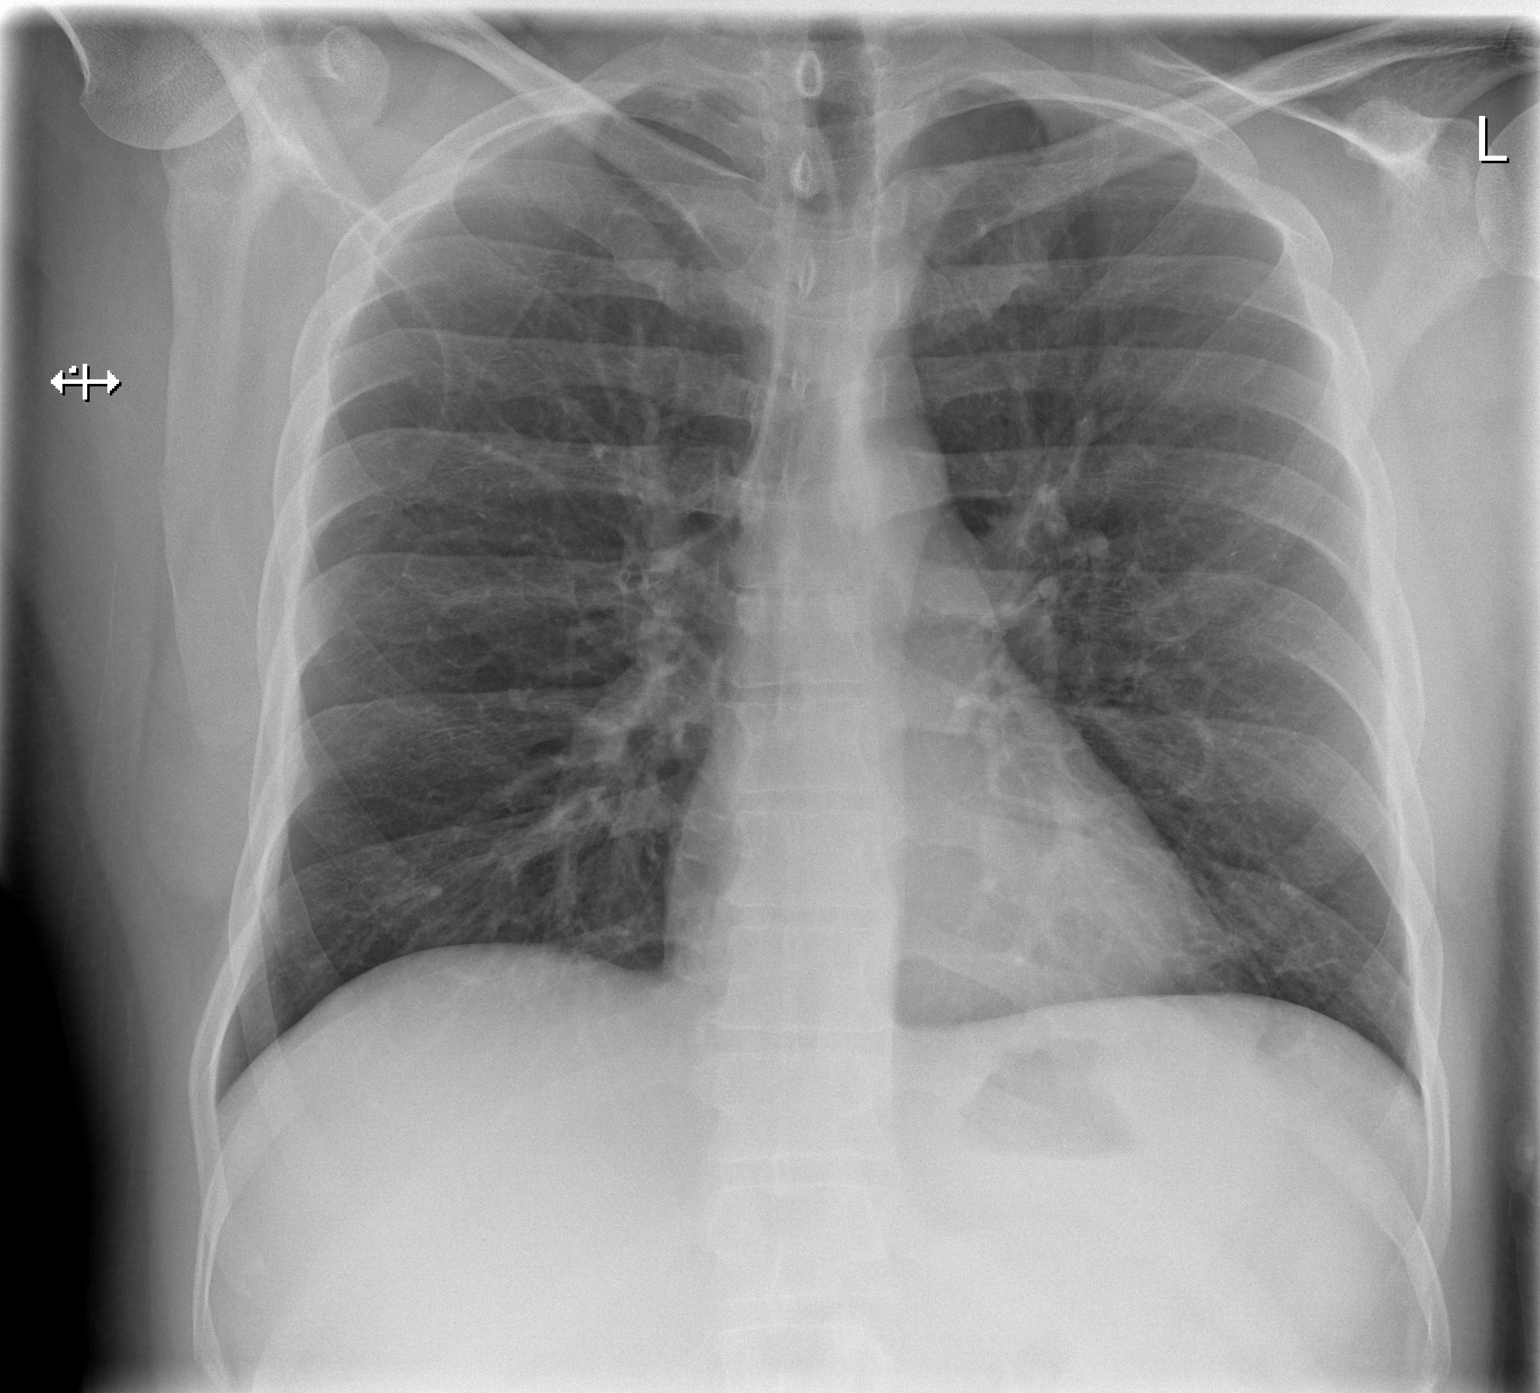

[w chest lat]
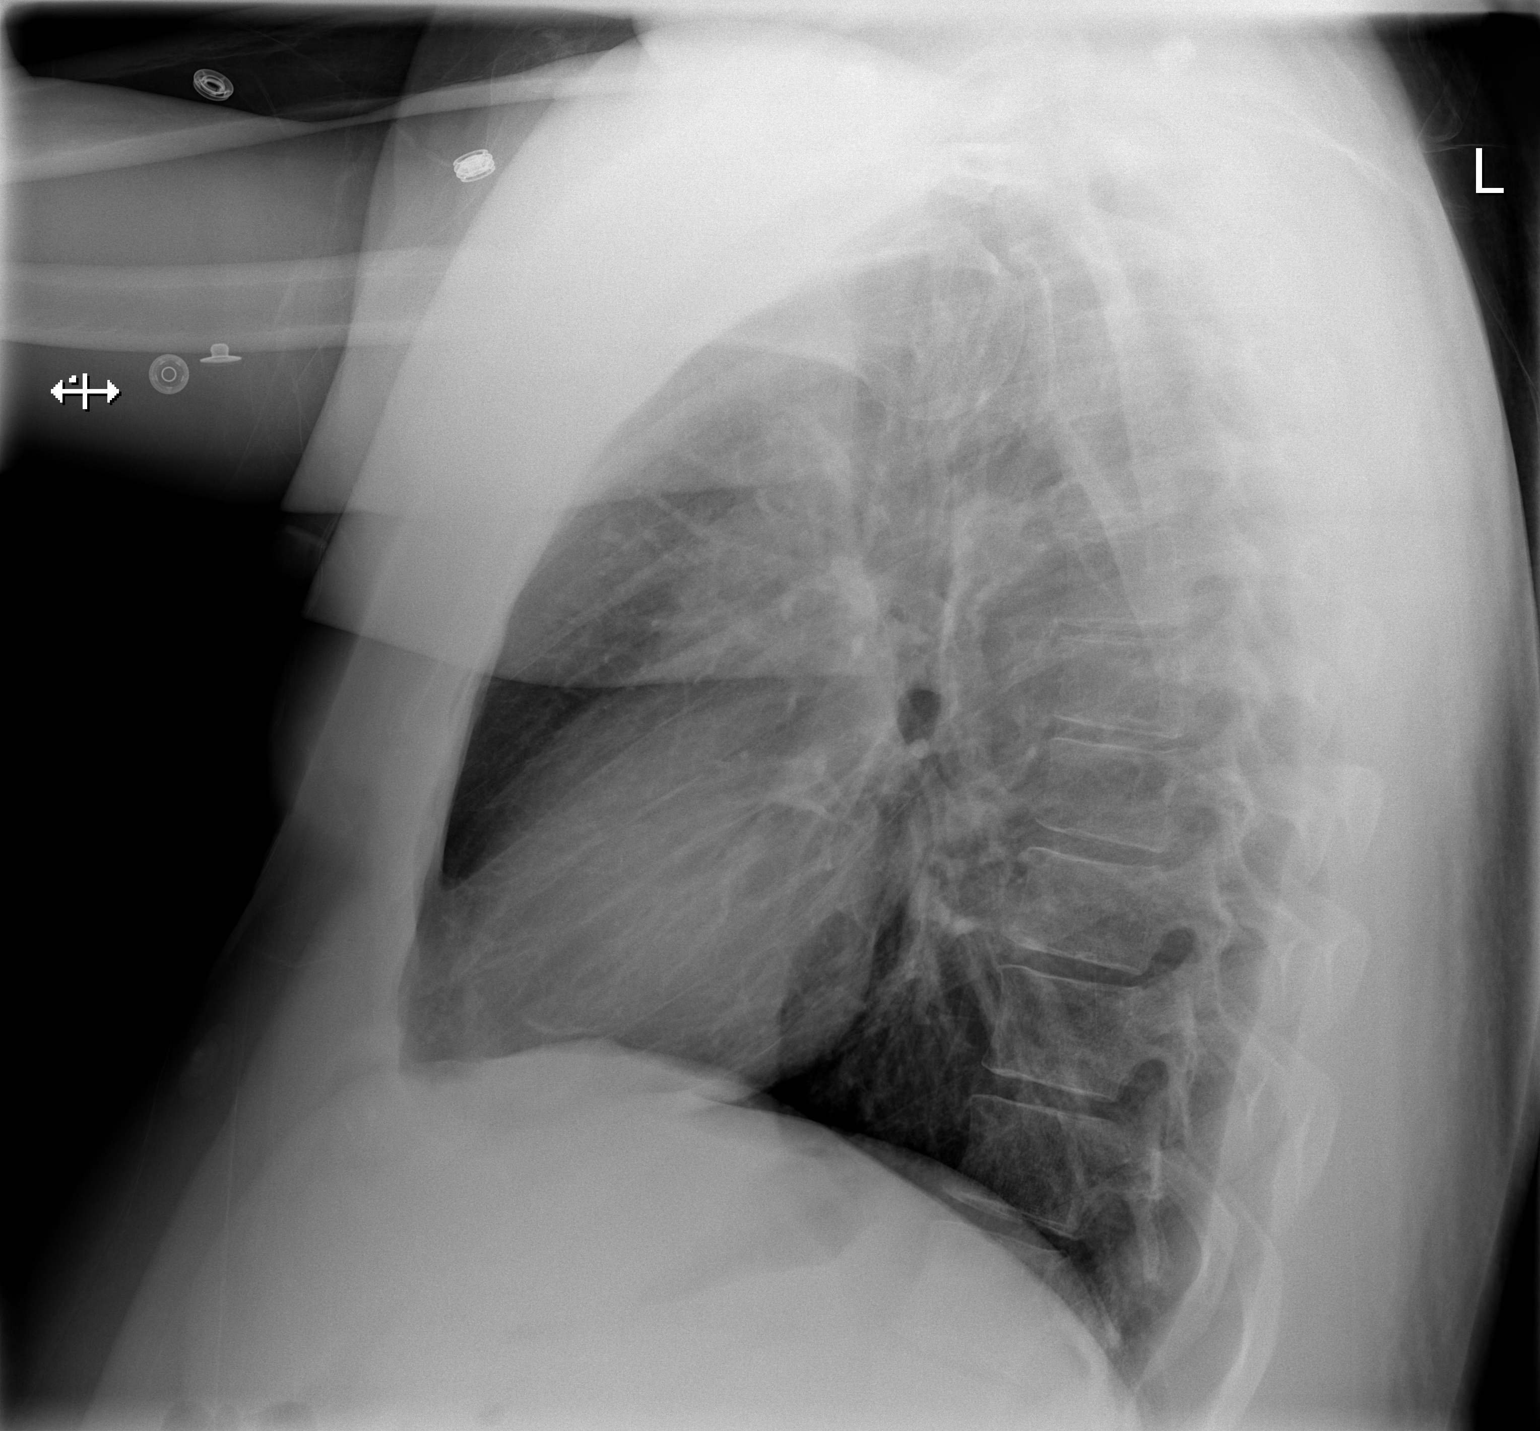

[2 of 2 positions shown; findings below may reference images not displayed]

FINDINGS: Normal sized heart. Clear lungs. Mild central peribronchial
thickening with improvement. Minimal linear scarring in the lingula.
Unremarkable bones.
IMPRESSION: Mild chronic bronchitic changes with improvement.
